# Patient Record
Sex: Female | Born: 1955
Health system: Southern US, Community
[De-identification: ages and names within clinical notes are randomized; demographics above are authoritative.]

## PROBLEM LIST (undated history)

## (undated) DIAGNOSIS — H3552 Pigmentary retinal dystrophy: Secondary | ICD-10-CM

## (undated) DIAGNOSIS — F419 Anxiety disorder, unspecified: Secondary | ICD-10-CM

## (undated) DIAGNOSIS — R5383 Other fatigue: Secondary | ICD-10-CM

## (undated) DIAGNOSIS — I872 Venous insufficiency (chronic) (peripheral): Secondary | ICD-10-CM

## (undated) DIAGNOSIS — R002 Palpitations: Secondary | ICD-10-CM

## (undated) DIAGNOSIS — R519 Headache, unspecified: Secondary | ICD-10-CM

## (undated) DIAGNOSIS — R011 Cardiac murmur, unspecified: Secondary | ICD-10-CM

## (undated) DIAGNOSIS — J309 Allergic rhinitis, unspecified: Secondary | ICD-10-CM

## (undated) DIAGNOSIS — H9319 Tinnitus, unspecified ear: Secondary | ICD-10-CM

## (undated) DIAGNOSIS — R001 Bradycardia, unspecified: Secondary | ICD-10-CM

## (undated) DIAGNOSIS — L409 Psoriasis, unspecified: Secondary | ICD-10-CM

## (undated) DIAGNOSIS — C801 Malignant (primary) neoplasm, unspecified: Secondary | ICD-10-CM

## (undated) DIAGNOSIS — R600 Localized edema: Secondary | ICD-10-CM

## (undated) DIAGNOSIS — M7500 Adhesive capsulitis of unspecified shoulder: Secondary | ICD-10-CM

## (undated) DIAGNOSIS — I471 Supraventricular tachycardia: Principal | ICD-10-CM

## (undated) DIAGNOSIS — E785 Hyperlipidemia, unspecified: Secondary | ICD-10-CM

## (undated) DIAGNOSIS — R42 Dizziness and giddiness: Secondary | ICD-10-CM

## (undated) DIAGNOSIS — R0602 Shortness of breath: Secondary | ICD-10-CM

## (undated) DIAGNOSIS — I10 Essential (primary) hypertension: Secondary | ICD-10-CM

## (undated) DIAGNOSIS — D229 Melanocytic nevi, unspecified: Secondary | ICD-10-CM

## (undated) DIAGNOSIS — E079 Disorder of thyroid, unspecified: Secondary | ICD-10-CM

## (undated) HISTORY — DX: Other fatigue: R53.83

## (undated) HISTORY — DX: Palpitations: R00.2

## (undated) HISTORY — DX: Essential (primary) hypertension: I10

## (undated) HISTORY — DX: Dizziness and giddiness: R42

## (undated) HISTORY — PX: OTHER SURGICAL HISTORY: SHX169

## (undated) HISTORY — PX: ABLATION ON ENDOMETRIOSIS: SHX5787

## (undated) HISTORY — DX: Disorder of thyroid, unspecified: E07.9

## (undated) HISTORY — DX: Headache, unspecified: R51.9

## (undated) HISTORY — DX: Supraventricular tachycardia: I47.1

## (undated) HISTORY — DX: Hyperlipidemia, unspecified: E78.5

## (undated) HISTORY — DX: Venous insufficiency (chronic) (peripheral): I87.2

## (undated) HISTORY — DX: Cardiac murmur, unspecified: R01.1

## (undated) HISTORY — DX: Bradycardia, unspecified: R00.1

## (undated) HISTORY — DX: Anxiety disorder, unspecified: F41.9

## (undated) HISTORY — PX: NASAL SINUS SURGERY: SHX719

## (undated) HISTORY — DX: Psoriasis, unspecified: L40.9

## (undated) HISTORY — DX: Malignant (primary) neoplasm, unspecified: C80.1

## (undated) HISTORY — DX: Pigmentary retinal dystrophy: H35.52

## (undated) HISTORY — PX: BREAST EXCISIONAL BIOPSY: SUR124

## (undated) HISTORY — DX: Shortness of breath: R06.02

## (undated) HISTORY — DX: Allergic rhinitis, unspecified: J30.9

## (undated) HISTORY — DX: Adhesive capsulitis of unspecified shoulder: M75.00

## (undated) HISTORY — DX: Localized edema: R60.0

## (undated) HISTORY — DX: Tinnitus, unspecified ear: H93.19

---
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
## 2011-06-02 HISTORY — PX: BREAST EXCISIONAL BIOPSY: SUR124

## 2016-03-27 DIAGNOSIS — D229 Melanocytic nevi, unspecified: Secondary | ICD-10-CM

## 2016-03-27 HISTORY — DX: Melanocytic nevi, unspecified: D22.9

## 2016-04-30 DIAGNOSIS — J019 Acute sinusitis, unspecified: Secondary | ICD-10-CM | POA: Diagnosis not present

## 2016-04-30 DIAGNOSIS — D485 Neoplasm of uncertain behavior of skin: Secondary | ICD-10-CM | POA: Diagnosis not present

## 2016-04-30 DIAGNOSIS — L988 Other specified disorders of the skin and subcutaneous tissue: Secondary | ICD-10-CM | POA: Diagnosis not present

## 2016-04-30 DIAGNOSIS — B9689 Other specified bacterial agents as the cause of diseases classified elsewhere: Secondary | ICD-10-CM | POA: Diagnosis not present

## 2016-06-08 DIAGNOSIS — H3552 Pigmentary retinal dystrophy: Secondary | ICD-10-CM | POA: Diagnosis not present

## 2016-06-16 DIAGNOSIS — J3089 Other allergic rhinitis: Secondary | ICD-10-CM | POA: Diagnosis not present

## 2016-06-19 DIAGNOSIS — H3552 Pigmentary retinal dystrophy: Secondary | ICD-10-CM | POA: Diagnosis not present

## 2016-06-30 DIAGNOSIS — H3552 Pigmentary retinal dystrophy: Secondary | ICD-10-CM | POA: Diagnosis not present

## 2016-07-07 DIAGNOSIS — H3552 Pigmentary retinal dystrophy: Secondary | ICD-10-CM | POA: Diagnosis not present

## 2016-07-12 DIAGNOSIS — J019 Acute sinusitis, unspecified: Secondary | ICD-10-CM | POA: Diagnosis not present

## 2016-07-21 DIAGNOSIS — H3552 Pigmentary retinal dystrophy: Secondary | ICD-10-CM | POA: Diagnosis not present

## 2016-07-21 DIAGNOSIS — H53483 Generalized contraction of visual field, bilateral: Secondary | ICD-10-CM | POA: Diagnosis not present

## 2016-07-27 DIAGNOSIS — H53483 Generalized contraction of visual field, bilateral: Secondary | ICD-10-CM | POA: Diagnosis not present

## 2016-07-27 DIAGNOSIS — H3552 Pigmentary retinal dystrophy: Secondary | ICD-10-CM | POA: Diagnosis not present

## 2016-08-03 DIAGNOSIS — H3552 Pigmentary retinal dystrophy: Secondary | ICD-10-CM | POA: Diagnosis not present

## 2016-08-03 DIAGNOSIS — H53483 Generalized contraction of visual field, bilateral: Secondary | ICD-10-CM | POA: Diagnosis not present

## 2016-08-04 DIAGNOSIS — R0981 Nasal congestion: Secondary | ICD-10-CM | POA: Diagnosis not present

## 2016-08-24 DIAGNOSIS — E039 Hypothyroidism, unspecified: Secondary | ICD-10-CM | POA: Diagnosis not present

## 2016-08-24 DIAGNOSIS — N3941 Urge incontinence: Secondary | ICD-10-CM | POA: Diagnosis not present

## 2016-08-24 DIAGNOSIS — F411 Generalized anxiety disorder: Secondary | ICD-10-CM | POA: Diagnosis not present

## 2016-08-24 DIAGNOSIS — I10 Essential (primary) hypertension: Secondary | ICD-10-CM | POA: Diagnosis not present

## 2016-11-10 ENCOUNTER — Other Ambulatory Visit: Payer: Self-pay | Admitting: Internal Medicine

## 2016-11-10 DIAGNOSIS — C4352 Malignant melanoma of skin of breast: Secondary | ICD-10-CM | POA: Diagnosis not present

## 2016-11-10 DIAGNOSIS — Z0000 Encounter for general adult medical examination without abnormal findings: Secondary | ICD-10-CM | POA: Diagnosis not present

## 2016-11-10 DIAGNOSIS — R001 Bradycardia, unspecified: Secondary | ICD-10-CM | POA: Diagnosis not present

## 2016-11-10 DIAGNOSIS — I10 Essential (primary) hypertension: Secondary | ICD-10-CM | POA: Diagnosis not present

## 2016-11-10 DIAGNOSIS — Z1231 Encounter for screening mammogram for malignant neoplasm of breast: Secondary | ICD-10-CM

## 2016-11-20 ENCOUNTER — Ambulatory Visit
Admission: RE | Admit: 2016-11-20 | Discharge: 2016-11-20 | Disposition: A | Discharge status: Home / Self Care | Payer: Medicare Other | Source: Ambulatory Visit | Attending: Internal Medicine | Admitting: Internal Medicine

## 2016-11-20 DIAGNOSIS — Z1231 Encounter for screening mammogram for malignant neoplasm of breast: Secondary | ICD-10-CM

## 2016-11-22 DIAGNOSIS — Z1212 Encounter for screening for malignant neoplasm of rectum: Secondary | ICD-10-CM | POA: Diagnosis not present

## 2016-11-22 DIAGNOSIS — Z1211 Encounter for screening for malignant neoplasm of colon: Secondary | ICD-10-CM | POA: Diagnosis not present

## 2016-12-08 ENCOUNTER — Ambulatory Visit (INDEPENDENT_AMBULATORY_CARE_PROVIDER_SITE_OTHER): Payer: Medicare Other | Admitting: Cardiovascular Disease

## 2016-12-08 ENCOUNTER — Encounter: Payer: Self-pay | Admitting: Cardiovascular Disease

## 2016-12-08 ENCOUNTER — Telehealth (HOSPITAL_COMMUNITY): Payer: Self-pay

## 2016-12-08 VITALS — BP 142/80 | HR 57 | Ht 67.0 in | Wt 190.0 lb

## 2016-12-08 DIAGNOSIS — R0609 Other forms of dyspnea: Secondary | ICD-10-CM

## 2016-12-08 DIAGNOSIS — E7800 Pure hypercholesterolemia, unspecified: Secondary | ICD-10-CM

## 2016-12-08 DIAGNOSIS — R5383 Other fatigue: Secondary | ICD-10-CM | POA: Insufficient documentation

## 2016-12-08 DIAGNOSIS — R600 Localized edema: Secondary | ICD-10-CM | POA: Diagnosis not present

## 2016-12-08 DIAGNOSIS — R001 Bradycardia, unspecified: Secondary | ICD-10-CM

## 2016-12-08 DIAGNOSIS — R011 Cardiac murmur, unspecified: Secondary | ICD-10-CM | POA: Diagnosis not present

## 2016-12-08 DIAGNOSIS — R0600 Dyspnea, unspecified: Secondary | ICD-10-CM

## 2016-12-08 DIAGNOSIS — E785 Hyperlipidemia, unspecified: Secondary | ICD-10-CM

## 2016-12-08 DIAGNOSIS — R002 Palpitations: Secondary | ICD-10-CM | POA: Diagnosis not present

## 2016-12-08 DIAGNOSIS — I10 Essential (primary) hypertension: Secondary | ICD-10-CM | POA: Diagnosis not present

## 2016-12-08 DIAGNOSIS — R0602 Shortness of breath: Secondary | ICD-10-CM

## 2016-12-08 HISTORY — DX: Hyperlipidemia, unspecified: E78.5

## 2016-12-08 HISTORY — DX: Palpitations: R00.2

## 2016-12-08 HISTORY — DX: Localized edema: R60.0

## 2016-12-08 HISTORY — DX: Shortness of breath: R06.02

## 2016-12-08 HISTORY — DX: Other fatigue: R53.83

## 2016-12-08 HISTORY — DX: Essential (primary) hypertension: I10

## 2016-12-08 NOTE — Telephone Encounter (Signed)
Encounter complete. 

## 2016-12-08 NOTE — Patient Instructions (Signed)
Medication Instructions:  START TAKING THE HYDROCHLOROTHIAZIDE 12.5 MG DAILY IN ADDITION TO YOUR OTHER MEDICATIONS  Labwork: NONE  Testing/Procedures: Your physician has requested that you have a lexiscan myoview. For further information please visit [email or web address]. Please follow instruction sheet, as given.  Your physician has requested that you have an echocardiogram. Echocardiography is a painless test that uses sound waves to create images of your heart. It provides your doctor with information about the size and shape of your heart and how well your heart's chambers and valves are working. This procedure takes approximately one hour. There are no restrictions for this procedure. [place] [address]  Your physician has recommended that you wear an event monitor. Event monitors are medical devices that record the heart's electrical activity. Doctors most often [place] these monitors to diagnose arrhythmias. Arrhythmias are problems with the speed or rhythm of the heartbeat. The monitor is a small, portable device. You can wear one while you do your normal daily activities. This is usually used to diagnose what is causing palpitations/syncope (passing out). [place] [address]   Follow-Up: Your physician recommends that you schedule a follow-up appointment in: 1 MONTH OV  Any Other Special Instructions Will Be Listed Below (If Applicable). MONITOR YOUR BLOOD PRESSURE AND BRING READINGS TO YOUR FOLLOW UP APPOINTMENT     If you need a refill on your cardiac medications before your next appointment, please call your pharmacy.

## 2016-12-08 NOTE — Progress Notes (Signed)
Cardiology Office Note   Date:  12/08/2016   ID:  Sheri Lopez, DOB 03-24-1956, MRN [id number]  PCP:  [name], MD  Cardiologist:   Skeet Latch, MD   Chief Complaint  Patient presents with  . New Patient (Initial Visit)  . Edema    left leg and ankles.     History of Present Illness: Sheri Lopez is a 61 y.o. female with hypertension, bradycardia, legal blindness, and breast cancer who is being seen today for the evaluation of murmur at the request of Schoenhoff, Altamese Cabal, *.  At her appointment with Dr. Coralyn Mark, Sheri Lopez was noted to be bradycardic and had a murmur on exam. Her heart rate has been as low as 44 bpm on EKG. She was referred to cardiology for further evaluation.  Sheri Lopez reports that she was first told she had a murmur approximately 30 years ago while pregnant. Incidentally has been intermittently noted exams. She recalls having an echocardiogram and a stress test at that time. She also had what sounds like a heart catheterization. Reportedly the results were all unremarkable and she has not followed up with a cardiologist since then. Lately she has been feeling fairly well but notes that she gets tired when walking. She also has episodes where she is exhausted and has to take a nap in the early afternoon. She denies chest pain or pressure, nausea, or diaphoresis. She does note episodes of palpitations That lasts for approximately 1 minute. It typically occurs at rest and feels that her heart is pounding. Sometimes he gets better with laying down and relaxing. The episodes occur sporadically approximately once or twice every 2 weeks.  Sheri Lopez endorses lower extremity edema that doesn't always improve with elevation.  It is been chronically worse in the left leg than the right. She denies orthopnea or PND. She reports that her father had a low heart rate and had bypass surgery in his 57s.She does not drink much caffeine or use over-the-counter  cold/cough medications regularly. In the past she liked exercise by walking but has been unable to do so lately due to the hot weather and her fatigue.  Past Medical History:  Diagnosis Date  . Allergic rhinitis   . Anxiety   . Bradycardia   . Cancer [facility])    breast  . Essential hypertension 12/08/2016  . Fatigue 12/08/2016  . Frozen shoulder   . Heart murmur   . Hyperlipidemia 12/08/2016  . Hypertension   . Lower extremity edema 12/08/2016  . Palpitations 12/08/2016  . Psoriasis   . Retinitis pigmentosa   . Shortness of breath 12/08/2016  . Thyroid disease   . Venous insufficiency     Past Surgical History:  Procedure Laterality Date  . BREAST EXCISIONAL BIOPSY Right   . BREAST EXCISIONAL BIOPSY Left 2013     Current Outpatient Prescriptions  Medication Sig Dispense Refill  . Azelastine-Fluticasone 137-50 MCG/ACT SUSP Place 1 puff into the nose 2 (two) times daily.    . cetirizine (ZYRTEC) 10 MG tablet Take 10 mg by mouth daily.    . citalopram (CELEXA) 20 MG tablet Take 20 mg by mouth daily.    . clobetasol cream (TEMOVATE) 5.40 % Apply 1 application topically 2 (two) times daily.    . hydrochlorothiazide (MICROZIDE) 12.5 MG capsule Take 12.5 mg by mouth daily.    Marland Kitchen levothyroxine (SYNTHROID, LEVOTHROID) 75 MCG tablet Take 75 mcg by mouth daily before breakfast.    . losartan-hydrochlorothiazide (HYZAAR)  100-12.5 MG tablet Take 1 tablet by mouth daily.    . NON FORMULARY Take by mouth daily. Plexus supplement     No current facility-administered medications for this visit.     Allergies:   Keflex [cephalexin]    Social History:  The patient  reports that she has never smoked. She has never used smokeless tobacco.   Family History:  [name] history includes Arrhythmia in her mother; Breast cancer in her maternal aunt; CAD in her father; Cancer in her father and mother; Heart attack in her maternal grandfather and paternal grandfather; Heart failure in her  paternal grandmother; Hyperlipidemia in her mother; Hypertension in her brother, mother, and sister.    ROS:  Please see the history of present illness.   Otherwise, review of systems are positive for none.   All other systems are reviewed and negative.    PHYSICAL EXAM: VS:  BP (!) 142/80   Pulse (!) 57   Ht 5\' 7"  (1.702 m)   Wt 86.2 kg (190 lb)   BMI 29.76 kg/m  , BMI Body mass index is 29.76 kg/m. GENERAL:  Well appearing.  No acute distress HEENT:  Pupils equal round and reactive, fundi not visualized, oral mucosa unremarkable NECK:  No jugular venous distention, waveform within normal limits, carotid upstroke brisk and symmetric, no bruits, no thyromegaly LYMPHATICS:  No cervical adenopathy LUNGS:  Clear to auscultation bilaterally HEART:  RRR.  PMI not displaced or sustained,S1 and S2 within normal limits, no S3, no S4, no clicks, no rubs, no murmurs ABD:  Flat, positive bowel sounds normal in frequency in pitch, no bruits, no rebound, no guarding, no midline pulsatile mass, no hepatomegaly, no splenomegaly EXT:  2 plus pulses throughout, no edema, no cyanosis no clubbing SKIN:  No rashes no nodules NEURO:  Cranial nerves II through XII grossly intact, motor grossly intact throughout PSYCH:  Cognitively intact, oriented to person place and time  EKG:  EKG is ordered today. The ekg ordered today demonstrates Sinus bradycardia. Rate 57 bpm. PVCs. Low-voltage 07/18/11: 11/10/16: Sinus bradycardia. Rate 48 bpm.  Recent Labs: No results found for requested labs within last 8760 hours.   08/24/16: Sodium 141, potassium 4.1, BUN 17, creatinine 0.94 AST 16, ALT 15 TSH 2.54 Total cholesterol 229, triglycerides 139, HDL 79, LDL 123   Lipid Panel No results found for: CHOL, TRIG, HDL, CHOLHDL, VLDL, LDLCALC, LDLDIRECT    Wt Readings from Last 3 Encounters:  12/08/16 86.2 kg (190 lb)      ASSESSMENT AND PLAN:  # Fatigue: # Shortness of breath: Symptoms could be consistent  with ischemia. She does have a family history of premature CAD. We will get a Lexiscan Myoview to assess. She is legally blind and is unable to walk on the treadmill.  # Hypertension: Blood pressure is elevated here and has been somewhat high at home. This may be contributing to her exertional dyspnea. We will increase hydrochlorothiazide to 25 mg daily. She will keep a log of her blood pressures at home.  # Palpitations:  # Bradycardia: 14 day event monitor.  # Hyperlipidemia: ASCVD 10 year risk is 6.0%. No aspirin or statin at this time. Work on diet and exercise.  # Murmur:  # Lower extremity edema: Sheri Lopez was noted to have a murmur on exam with her PCP.  This is been noted intermittently for several years. She also has lower extremity edema on exam. Although I did not hear a murmur on exam  today, we will get an echocardiogram to assess.    Current medicines are reviewed at length with the patient today.  The patient does not have concerns regarding medicines.  The following changes have been made:  Increase HCTZ to 25mg  daily  Labs/ tests ordered today include:   Orders Placed This Encounter  Procedures  . Myocardial Perfusion Imaging  . Cardiac event monitor  . EKG 12-Lead  . ECHOCARDIOGRAM COMPLETE     Disposition:   FU with Maiyah Goyne C. [name], MD, [facility] in month.    This note was written with the assistance of speech recognition software.  Please excuse any transcriptional errors.  Signed, Ludivina Guymon C. [name], MD, [facility]  12/08/2016 1:28 PM    [place]

## 2016-12-09 ENCOUNTER — Ambulatory Visit (HOSPITAL_COMMUNITY)
Admission: RE | Admit: 2016-12-09 | Discharge: 2016-12-09 | Disposition: A | Discharge status: Home / Self Care | Payer: Medicare Other | Source: Ambulatory Visit | Attending: Cardiology | Admitting: Cardiology

## 2016-12-09 DIAGNOSIS — I10 Essential (primary) hypertension: Secondary | ICD-10-CM | POA: Diagnosis not present

## 2016-12-09 DIAGNOSIS — R0609 Other forms of dyspnea: Secondary | ICD-10-CM | POA: Diagnosis not present

## 2016-12-09 DIAGNOSIS — Z8249 Family history of ischemic heart disease and other diseases of the circulatory system: Secondary | ICD-10-CM | POA: Diagnosis not present

## 2016-12-09 DIAGNOSIS — R002 Palpitations: Secondary | ICD-10-CM

## 2016-12-09 DIAGNOSIS — R5383 Other fatigue: Secondary | ICD-10-CM | POA: Diagnosis not present

## 2016-12-09 DIAGNOSIS — R0600 Dyspnea, unspecified: Secondary | ICD-10-CM

## 2016-12-09 MED ORDER — REGADENOSON 0.4 MG/5ML IV SOLN
0.4000 mg | Freq: Once | INTRAVENOUS | Status: AC
  Administered 2016-12-09: 0.4 mg via INTRAVENOUS

## 2016-12-09 MED ORDER — TECHNETIUM TC 99M TETROFOSMIN IV KIT
32.1000 | PACK | Freq: Once | INTRAVENOUS | Status: AC | PRN
  Administered 2016-12-09: 32.1 via INTRAVENOUS
  Filled 2016-12-09: qty 33

## 2016-12-09 MED ORDER — TECHNETIUM TC 99M TETROFOSMIN IV KIT
10.1000 | PACK | Freq: Once | INTRAVENOUS | Status: AC | PRN
  Administered 2016-12-09: 10.1 via INTRAVENOUS
  Filled 2016-12-09: qty 11

## 2016-12-10 LAB — MYOCARDIAL PERFUSION IMAGING
LV dias vol: 118 mL (ref 46–106)
LV sys vol: 49 mL
Peak HR: 71 {beats}/min
Rest HR: 49 {beats}/min
SDS: 2
SRS: 0
SSS: 2
TID: 1.13

## 2016-12-21 ENCOUNTER — Other Ambulatory Visit: Payer: Self-pay

## 2016-12-21 ENCOUNTER — Ambulatory Visit (HOSPITAL_COMMUNITY): Payer: Medicare Other | Attending: Cardiovascular Disease

## 2016-12-21 DIAGNOSIS — R011 Cardiac murmur, unspecified: Secondary | ICD-10-CM | POA: Diagnosis not present

## 2016-12-21 DIAGNOSIS — R0600 Dyspnea, unspecified: Secondary | ICD-10-CM

## 2016-12-21 DIAGNOSIS — R0609 Other forms of dyspnea: Secondary | ICD-10-CM

## 2016-12-21 DIAGNOSIS — R002 Palpitations: Secondary | ICD-10-CM

## 2016-12-21 LAB — ECHOCARDIOGRAM COMPLETE
E decel time: 275 msec
E/e' ratio: 9.12
FS: 31 % (ref 28–44)
IVS/LV PW RATIO, ED: 1.19
LA ID, A-P, ES: 39 mm
LA diam end sys: 39 mm
LA diam index: 1.97 cm/m2
LA vol A4C: 48.4 ml
LA vol index: 27.2 mL/m2
LA vol: 53.9 mL
LV E/e' medial: 9.12
LV E/e'average: 9.12
LV PW d: 8.4 mm — AB (ref 0.6–1.1)
LV e' LATERAL: 9.14 cm/s
LVOT SV: 76 mL
LVOT VTI: 24.1 cm
LVOT area: 3.14 cm2
LVOT diameter: 20 mm
LVOT peak grad rest: 5 mmHg
LVOT peak vel: 112 cm/s
Lateral S' vel: 12.8 cm/s
MV Dec: 275
MV Peak grad: 3 mmHg
MV pk A vel: 64 m/s
MV pk E vel: 83.4 m/s
Reg peak vel: 201 cm/s
TAPSE: 23.4 mm
TDI e' lateral: 9.14
TDI e' medial: 8.59
TR max vel: 201 cm/s

## 2017-01-04 ENCOUNTER — Ambulatory Visit (INDEPENDENT_AMBULATORY_CARE_PROVIDER_SITE_OTHER): Payer: Medicare Other

## 2017-01-04 DIAGNOSIS — R002 Palpitations: Secondary | ICD-10-CM | POA: Diagnosis not present

## 2017-01-04 DIAGNOSIS — R0600 Dyspnea, unspecified: Secondary | ICD-10-CM

## 2017-01-04 DIAGNOSIS — R0609 Other forms of dyspnea: Secondary | ICD-10-CM | POA: Diagnosis not present

## 2017-01-27 ENCOUNTER — Ambulatory Visit (INDEPENDENT_AMBULATORY_CARE_PROVIDER_SITE_OTHER): Payer: Medicare Other | Admitting: Cardiovascular Disease

## 2017-01-27 ENCOUNTER — Encounter: Payer: Self-pay | Admitting: Cardiovascular Disease

## 2017-01-27 VITALS — BP 122/82 | HR 60 | Ht 67.75 in | Wt 191.6 lb

## 2017-01-27 DIAGNOSIS — R0602 Shortness of breath: Secondary | ICD-10-CM | POA: Diagnosis not present

## 2017-01-27 DIAGNOSIS — I471 Supraventricular tachycardia: Secondary | ICD-10-CM | POA: Diagnosis not present

## 2017-01-27 DIAGNOSIS — I10 Essential (primary) hypertension: Secondary | ICD-10-CM | POA: Diagnosis not present

## 2017-01-27 DIAGNOSIS — I4719 Other supraventricular tachycardia: Secondary | ICD-10-CM

## 2017-01-27 DIAGNOSIS — I491 Atrial premature depolarization: Secondary | ICD-10-CM | POA: Diagnosis not present

## 2017-01-27 DIAGNOSIS — R600 Localized edema: Secondary | ICD-10-CM | POA: Diagnosis not present

## 2017-01-27 DIAGNOSIS — I493 Ventricular premature depolarization: Secondary | ICD-10-CM | POA: Diagnosis not present

## 2017-01-27 HISTORY — DX: Other supraventricular tachycardia: I47.19

## 2017-01-27 HISTORY — DX: Supraventricular tachycardia: I47.1

## 2017-01-27 MED ORDER — HYDROCHLOROTHIAZIDE 12.5 MG PO TABS: ORAL_TABLET | ORAL | 1 refills | Status: DC

## 2017-01-27 NOTE — Progress Notes (Signed)
Cardiology Office Note   Date:  01/27/2017   ID:  Sheri Lopez, DOB [birth year], MRN [id number]  PCP:  [name], MD  Cardiologist:   Skeet Latch, MD   Chief Complaint  Patient presents with  . Follow-up    History of Present Illness: Sheri Lopez is a 61 y.o. female with hypertension, bradycardia, legal blindness, and breast cancer here for follow-up. She was initially seen 11/2016 for the evaluation of murmur.  She had previously seen her PCP and was noted to be bradycardic. Her heart rate was 44 on the ECG. She was also noted to have systolic murmur. Upon her clinical appointment with me no murmur was appreciated.  She was referred for an echo that revealed LVEF 55-60% and no significant valvular abnormalities. She also reported atypical chest pain and was referred for [facility] 12/09/16 where she was found to have normal systolic function and no ischemia.  She reported palpitations and had a 14 day event monitor that showed PACs, PVCs, and atrial tachycardia. She was noted to have both sinus rhythm and sinus bradycardia with an average heart rate of 62 bpm. There were no significant pauses.  At her last appointment [name] reported lower extremity edema and her blood pressure was poorly-controlled. Her thiazide was increased to 25 mg daily.  She brings a log of her blood pressures today that showed it has ranged from 90s to low 140s of her blood pressures have been the 100s to 110s. She continues to report shortness of breath with exertion. She particularly notes that when walking up the steps. At these times she feels that her heart is pounding. She also notes heart racing at rest that is very bothersome to her.  She also reports persistent lower extremity edema that has been worse this summer than in the past. She has a history of venous insufficiency and his previously undergone stripping of her lower extremity veins twice. She notes that she has not been wearing  compression socks lately and that she has been lax on limiting salt intake.  She denies any orthopnea or PND.   Past Medical History:  Diagnosis Date  . Allergic rhinitis   . Anxiety   . Atrial tachycardia ([place]) 01/27/2017  . Bradycardia   . Cancer [facility])    breast  . Essential hypertension 12/08/2016  . Fatigue 12/08/2016  . Frozen shoulder   . Heart murmur   . Hyperlipidemia 12/08/2016  . Hypertension   . Lower extremity edema 12/08/2016  . Palpitations 12/08/2016  . Psoriasis   . Retinitis pigmentosa   . Shortness of breath 12/08/2016  . Thyroid disease   . Venous insufficiency     Past Surgical History:  Procedure Laterality Date  . BREAST EXCISIONAL BIOPSY Right   . BREAST EXCISIONAL BIOPSY Left 2013     Current Outpatient Prescriptions  Medication Sig Dispense Refill  . Azelastine-Fluticasone 137-50 MCG/ACT SUSP Place 1 puff into the nose 2 (two) times daily.    . cetirizine (ZYRTEC) 10 MG tablet Take 10 mg by mouth daily.    . citalopram (CELEXA) 20 MG tablet Take 20 mg by mouth daily.    . clobetasol cream (TEMOVATE) 0.17 % Apply 1 application topically 2 (two) times daily. AS NEEDED    . levothyroxine (SYNTHROID, LEVOTHROID) 75 MCG tablet Take 75 mcg by mouth daily before breakfast.    . losartan-hydrochlorothiazide (HYZAAR) 100-12.5 MG tablet Take 1 tablet by mouth daily.    . NON FORMULARY  Take by mouth daily. Plexus supplement    . hydrochlorothiazide (HYDRODIURIL) 12.5 MG tablet 1/2 tablet by mouth daily 45 tablet 1   No current facility-administered medications for this visit.     Allergies:   Keflex [cephalexin]    Social History:  The patient  reports that she has never smoked. She has never used smokeless tobacco.   Family History:  [name] history includes Arrhythmia in her mother; Breast cancer in her maternal aunt; CAD in her father; Cancer in her father and mother; Heart attack in her maternal grandfather and paternal grandfather; Heart  failure in her paternal grandmother; Hyperlipidemia in her mother; Hypertension in her brother, mother, and sister.    ROS:  Please see the history of present illness.   Otherwise, review of systems are positive for none.   All other systems are reviewed and negative.    PHYSICAL EXAM: VS:  BP 122/82   Pulse 60   Ht 5' 7.75" (1.721 m)   Wt 86.9 kg (191 lb 9.6 oz)   BMI 29.35 kg/m  , BMI Body mass index is 29.35 kg/m. GENERAL:  Well appearing.  No acute distress HEENT:  Pupils equal round and reactive, fundi not visualized, oral mucosa unremarkable NECK:  No jugular venous distention, waveform within normal limits, carotid upstroke brisk and symmetric, no bruits, no thyromegaly LYMPHATICS:  No cervical adenopathy LUNGS:  Clear to auscultation bilaterally HEART:  RRR.  PMI not displaced or sustained,S1 and S2 within normal limits, no S3, no S4, no clicks, no rubs, no murmurs ABD:  Flat, positive bowel sounds normal in frequency in pitch, no bruits, no rebound, no guarding, no midline pulsatile mass, no hepatomegaly, no splenomegaly EXT:  2 plus pulses throughout, no edema, no cyanosis no clubbing SKIN:  No rashes no nodules NEURO:  Cranial nerves II through XII grossly intact, motor grossly intact throughout PSYCH:  Cognitively intact, oriented to person place and time  EKG:  EKG is ordered today. The ekg ordered today demonstrates Sinus bradycardia. Rate 57 bpm. PVCs. Low-voltage 07/18/11: 11/10/16: Sinus bradycardia. Rate 48 bpm.  Lexiscan Myoview 12/10/16::  Normal perfusion No ischemia  Nuclear stress EF: 59%.  Low risk study   Echo 12/21/16:  Study Conclusions  - Left ventricle: The cavity size was normal. Systolic function was   normal. The estimated ejection fraction was in the range of 55%   to 60%. Wall motion was normal; there were no regional wall   motion abnormalities. Left ventricular diastolic function   parameters were normal.  14 Day Event Monitor  01/04/17:  Quality: Fair.  Baseline artifact. Predominant rhythm: sinus rhythm, sinus bradycardia Average heart rate: 62 bpm  Sinus rhythm and sinus bradycardia  PACs, PVCs Atrial tachycardia  Recent Labs: No results found for requested labs within last 8760 hours.   08/24/16: Sodium 141, potassium 4.1, BUN 17, creatinine 0.94 AST 16, ALT 15 TSH 2.54 Total cholesterol 229, triglycerides 139, HDL 79, LDL 123   Lipid Panel No results found for: CHOL, TRIG, HDL, CHOLHDL, VLDL, LDLCALC, LDLDIRECT    Wt Readings from Last 3 Encounters:  01/27/17 86.9 kg (191 lb 9.6 oz)  12/09/16 86.2 kg (190 lb)  12/08/16 86.2 kg (190 lb)      ASSESSMENT AND PLAN:  PFTs EP for atrial tach HCTZ 6.25   # Fatigue: # Shortness of breath: Ms. Frary continues to have symptoms.  Her stress test and echo have been unremarkable.  We will get PFTs.    #  Hypertension: BP well-controlled and has been low at times.  Reduce HCTZ to 6.25 mg and continue the combination HCTZ/losartan at 12.5/100mg .    # Atrial tachycardia: # PACs/PVCs: She continues to have palpitations both at rest and with exertion.  No nodal agents given bradycardia in the 50s at baseline.  We will refer her to EP to see if she may be a candidate for ablation vs. antiarrhythmias for the atrial tachycardia.   # Hyperlipidemia: ASCVD 10 year risk is 6.0%. No aspirin or statin at this time. Work on diet and exercise.  # Murmur:  # Lower extremity edema: No significant valvular disease.  IVC was not dilated and she has no evidence of heart failure.  I suspect that her symtpoms are 2/2 venous insufficiency.  Recommended salt restriction, elevation and compression stockings. HCTZ as above.    Current medicines are reviewed at length with the patient today.  The patient does not have concerns regarding medicines.  The following changes have been made:  Decrease HCTZ  Labs/ tests ordered today include:   Orders Placed This  Encounter  Procedures  . Ambulatory referral to Cardiac Electrophysiology  . Pulmonary function test     Disposition:   FU with Chonda Baney C. [name], MD, [facility] in 2 months.    This note was written with the assistance of speech recognition software.  Please excuse any transcriptional errors.  Signed, Vaun Hyndman C. [name], MD, [facility]  01/27/2017 1:17 PM    [place]

## 2017-01-27 NOTE — Patient Instructions (Signed)
Medication Instructions:  DECREASE YOUR HYDROCHLOROTHIAZIDE TO 12.5 MG 1/2 TABLET DAILY   Labwork: NONE  Testing/Procedures: Your physician has recommended that you have a pulmonary function test. Pulmonary Function Tests are a group of tests that measure how well air moves in and out of your lungs.  Follow-Up: Your physician recommends that you schedule a follow-up appointment in: 2 Harvey have been referred to ELECTROPHYSIOLOGIST AT [place]  If you need a refill on your cardiac medications before your next appointment, please call your pharmacy.

## 2017-02-02 ENCOUNTER — Ambulatory Visit (HOSPITAL_COMMUNITY)
Admission: RE | Admit: 2017-02-02 | Discharge: 2017-02-02 | Disposition: A | Discharge status: Home / Self Care | Payer: Medicare Other | Source: Ambulatory Visit | Attending: Cardiovascular Disease | Admitting: Cardiovascular Disease

## 2017-02-02 DIAGNOSIS — R0602 Shortness of breath: Secondary | ICD-10-CM

## 2017-02-02 LAB — PULMONARY FUNCTION TEST
DL/VA % pred: 85 %
DL/VA: 4.51 ml/min/mmHg/L
DLCO unc % pred: 79 %
DLCO unc: 23.63 ml/min/mmHg
FEF 25-75 Post: 3.43 L/sec
FEF 25-75 Pre: 2.36 L/sec
FEF2575-%Change-Post: 45 %
FEF2575-%Pred-Post: 137 %
FEF2575-%Pred-Pre: 94 %
FEV1-%Change-Post: 10 %
FEV1-%Pred-Post: 101 %
FEV1-%Pred-Pre: 91 %
FEV1-Post: 2.93 L
FEV1-Pre: 2.66 L
FEV1FVC-%Change-Post: 3 %
FEV1FVC-%Pred-Pre: 99 %
FEV6-%Change-Post: 6 %
FEV6-%Pred-Post: 100 %
FEV6-%Pred-Pre: 93 %
FEV6-Post: 3.62 L
FEV6-Pre: 3.39 L
FEV6FVC-%Change-Post: 0 %
FEV6FVC-%Pred-Post: 104 %
FEV6FVC-%Pred-Pre: 103 %
FVC-%Change-Post: 6 %
FVC-%Pred-Post: 96 %
FVC-%Pred-Pre: 90 %
FVC-Post: 3.62 L
FVC-Pre: 3.41 L
Post FEV1/FVC ratio: 81 %
Post FEV6/FVC ratio: 100 %
Pre FEV1/FVC ratio: 78 %
Pre FEV6/FVC Ratio: 99 %
RV % pred: 87 %
RV: 1.95 L
TLC % pred: 101 %
TLC: 5.76 L

## 2017-02-02 MED ORDER — ALBUTEROL SULFATE (2.5 MG/3ML) 0.083% IN NEBU
2.5000 mg | INHALATION_SOLUTION | Freq: Once | RESPIRATORY_TRACT | Status: AC
  Administered 2017-02-02: 2.5 mg via RESPIRATORY_TRACT

## 2017-02-08 ENCOUNTER — Encounter: Payer: Self-pay | Admitting: Cardiology

## 2017-02-08 ENCOUNTER — Ambulatory Visit (INDEPENDENT_AMBULATORY_CARE_PROVIDER_SITE_OTHER): Payer: Medicare Other | Admitting: Cardiology

## 2017-02-08 VITALS — BP 120/84 | HR 54 | Ht 68.0 in | Wt 192.8 lb

## 2017-02-08 DIAGNOSIS — E785 Hyperlipidemia, unspecified: Secondary | ICD-10-CM | POA: Diagnosis not present

## 2017-02-08 DIAGNOSIS — I471 Supraventricular tachycardia: Secondary | ICD-10-CM | POA: Diagnosis not present

## 2017-02-08 MED ORDER — FLECAINIDE ACETATE 100 MG PO TABS: 100.0000 mg | ORAL_TABLET | Freq: Two times a day (BID) | ORAL | 3 refills | Status: DC

## 2017-02-08 MED ORDER — METOPROLOL SUCCINATE ER 25 MG PO TB24: 12.5000 mg | ORAL_TABLET | Freq: Every day | ORAL | 3 refills | Status: DC

## 2017-02-08 NOTE — Patient Instructions (Signed)
Your physician has recommended you make the following change in your medication:  1.) start Toprol XL 12.5 mg (1/2 tablet) daily 2.) start flecainide 100 mg twice daily  Your physician has requested that you have an exercise tolerance test. For further information please visit [email or web address]. Please also follow instruction sheet, as given.--PLEASE SCHEDULE IN ABOUT 7-10 DAYS--PT STARTING FLECAINIDE  Your physician recommends that you schedule a follow-up appointment in: EARLY November WITH DR. Curt Bears.

## 2017-02-08 NOTE — Progress Notes (Signed)
Electrophysiology Office Note   Date:  02/08/2017   ID:  Sheri Lopez, DOB [birth year], MRN [id number]  PCP:  [name], MD  Cardiologist:  [name] Primary Electrophysiologist:  Brigida Scotti [name], MD    Chief Complaint  Patient presents with  . New Patient (Initial Visit)    Atrial Tachycardia/SOB     History of Present Illness: Sheri Lopez is a 61 y.o. female who is being seen today for the evaluation of SVT at the request of Schoenhoff, Altamese Cabal, *. Presenting today for electrophysiology evaluation. She has a history of hypertension, bradycardia, legal blindness, and breast cancer. She was noted. Bradycardic by her PCP with heart rates of 44 on EKG. She has had an echo that showed normal systolic function and a Myoview with normal function and no ischemia. She had palpitations and wore a 14 day monitor that showed PACs, PVCs and atrial tachycardia. Her average heart rate was 62. There were no significant pauses. She has shortness of breath with exertion. She notes that she is having trouble walking upstairs. She feels palpitations at that time.    Today, she denies symptoms of palpitations, chest pain, shortness of breath, orthopnea, PND, lower extremity edema, claudication, dizziness, presyncope, syncope, bleeding, or neurologic sequela. The patient is tolerating medications without difficulties. Her main complaint today is of fatigue. She does get palpitations a few times a week. No exacerbating or alleviating factors. They last for a few seconds up to a few minutes at a time.   Past Medical History:  Diagnosis Date  . Allergic rhinitis   . Anxiety   . Atrial tachycardia ([place]) 01/27/2017  . Bradycardia   . Cancer [facility])    breast  . Essential hypertension 12/08/2016  . Fatigue 12/08/2016  . Frozen shoulder   . Heart murmur   . Hyperlipidemia 12/08/2016  . Hypertension   . Lower extremity edema 12/08/2016  . Palpitations 12/08/2016  . Psoriasis   . Retinitis  pigmentosa   . Shortness of breath 12/08/2016  . Thyroid disease   . Venous insufficiency    Past Surgical History:  Procedure Laterality Date  . BREAST EXCISIONAL BIOPSY Right   . BREAST EXCISIONAL BIOPSY Left 2013     Current Outpatient Prescriptions  Medication Sig Dispense Refill  . Azelastine-Fluticasone 137-50 MCG/ACT SUSP Place 1 puff into the nose 2 (two) times daily.    . cetirizine (ZYRTEC) 10 MG tablet Take 10 mg by mouth daily.    . citalopram (CELEXA) 20 MG tablet Take 20 mg by mouth daily.    . clobetasol cream (TEMOVATE) 9.38 % Apply 1 application topically 2 (two) times daily. AS NEEDED    . hydrochlorothiazide (HYDRODIURIL) 12.5 MG tablet 1/2 tablet by mouth daily 45 tablet 1  . ibuprofen (ADVIL,MOTRIN) 200 MG tablet Take 600 mg by mouth every 6 (six) hours as needed (tooth pain).    Marland Kitchen levothyroxine (SYNTHROID, LEVOTHROID) 75 MCG tablet Take 75 mcg by mouth daily before breakfast.    . losartan-hydrochlorothiazide (HYZAAR) 100-12.5 MG tablet Take 1 tablet by mouth daily.    . NON FORMULARY Take by mouth daily. Plexus supplement    . penicillin v potassium (VEETID) 500 MG tablet Take 500 mg by mouth 4 (four) times daily.     No current facility-administered medications for this visit.     Allergies:   Keflex [cephalexin]   Social History:  The patient  reports that she has never smoked. She has never used smokeless tobacco.  Family History:  [name] history includes Arrhythmia in her mother; Breast cancer in her maternal aunt; CAD in her father; Cancer in her father and mother; Heart attack in her maternal grandfather and paternal grandfather; Heart failure in her paternal grandmother; Hyperlipidemia in her mother; Hypertension in her brother, mother, and sister.    ROS:  Please see the history of present illness.   Otherwise, review of systems is positive for Palpitations, fatigue.   All other systems are reviewed and negative.    PHYSICAL  EXAM: VS:  BP 120/84   Pulse (!) 54   Ht 5\' 8"  (1.727 m)   Wt 192 lb 12.8 oz (87.5 kg)   BMI 29.32 kg/m  , BMI Body mass index is 29.32 kg/m. GEN: Well nourished, well developed, in no acute distress  HEENT: normal  Neck: no JVD, carotid bruits, or masses Cardiac: RRR; no murmurs, rubs, or gallops,no edema  Respiratory:  clear to auscultation bilaterally, normal work of breathing GI: soft, nontender, nondistended, + BS MS: no deformity or atrophy  Skin: warm and dry Neuro:  Strength and sensation are intact Psych: euthymic mood, full affect  EKG:  EKG is not ordered today. Personal review of the ekg ordered 12/08/16 shows sinus rhythm, rate 59, PVC  Recent Labs: No results found for requested labs within last 8760 hours.    Lipid Panel  No results found for: CHOL, TRIG, HDL, CHOLHDL, VLDL, LDLCALC, LDLDIRECT   Wt Readings from Last 3 Encounters:  02/08/17 192 lb 12.8 oz (87.5 kg)  01/27/17 191 lb 9.6 oz (86.9 kg)  12/09/16 190 lb (86.2 kg)      Other studies Reviewed: Additional studies/ records that were reviewed today include: Myoview 12/10/16  Review of the above records today demonstrates:   Normal perfusion No ischemia  Nuclear stress EF: 59%.  Low risk study  TTE 12/21/16 - Left ventricle: The cavity size was normal. Systolic function was   normal. The estimated ejection fraction was in the range of 55%   to 60%. Wall motion was normal; there were no regional wall   motion abnormalities. Left ventricular diastolic function   parameters were normal.  Cardiac monitor 01/26/17 - personally reviewed Predominant rhythm: sinus rhythm, sinus bradycardia Average heart rate: 62 bpm  Sinus rhythm and sinus bradycardia  PACs, PVCs Atrial tachycardia  ASSESSMENT AND PLAN:  1.  Atrial tachycardia: Currently non-AV nodal blocking agents due to resting heart rate in the 50s.She would benefit from antiarrhythmics, but would also benefit likely from ablation. Risks  and benefits of ablation were discussed. Risks include bleeding, tamponade, heart block, and stroke among others. She would like to think about this for a few months. In the interim, we'll start her on 12.5 mg of metoprolol as well as 100 mg of flecainide. She Leane Loring come back in 7-10 days for an exercise treadmill test.  2. Shortness of breath: Stress testing and echo unremarkable. PFTs per primary cardiology.  3. Hyperlipidemia: Currently no aspirin or statin. Diet and exercise.  Current medicines are reviewed at length with the patient today.   The patient does not have concerns regarding her medicines.  The following changes were made today:  Metoprolol, flecainide  Labs/ tests ordered today include:  No orders of the defined types were placed in this encounter.    Disposition:   FU with Dorethia Jeanmarie 2 months  Signed, Obediah Welles [name], MD  02/08/2017 3:40 PM     [facility] [address] [place] [address] [phone] (office) [phone] (fax)

## 2017-02-09 ENCOUNTER — Telehealth: Payer: Self-pay | Admitting: Cardiology

## 2017-02-09 ENCOUNTER — Telehealth: Payer: Self-pay | Admitting: *Deleted

## 2017-02-09 MED ORDER — ALBUTEROL SULFATE HFA 108 (90 BASE) MCG/ACT IN AERS: 2.0000 | INHALATION_SPRAY | RESPIRATORY_TRACT | 2 refills | Status: AC | PRN

## 2017-02-09 NOTE — Telephone Encounter (Signed)
-----   Message from Skeet Latch, MD sent at 02/08/2017  1:29 PM EDT ----- Minimal airway obstruction.  This probably isn't enough to cause her shortness of breath.  Lets try an albuterol inhaler 2 puffs q4h as needed.

## 2017-02-09 NOTE — Telephone Encounter (Signed)
Confirmed with patient instructions to stop Citalopram and start Sertraline 50 mg daily.  Educated that Citalopram and Flecainide cannot be taken together.  Pt informs me that her PCP called and advised of this and made the medication change to safer med to take w/ Flecainide. She thanks me for following up. Will update pt med list to reflect change

## 2017-02-09 NOTE — Telephone Encounter (Signed)
New message   Sheri Lopez called and verbalized that there is a change in medication and she was citalopram 20mg  1x day from her PCP and   She also has the flecainide 100mg  2x day was prescribed 02-08-17 and pharmacist called PCP and changed medication sertraline 50mg  1xday   And the PCP wants Dr.Camnitz to be aware of changes in medication

## 2017-02-09 NOTE — Telephone Encounter (Signed)
Advised patient, verbalized understanding  

## 2017-02-17 ENCOUNTER — Ambulatory Visit (INDEPENDENT_AMBULATORY_CARE_PROVIDER_SITE_OTHER): Payer: Medicare Other

## 2017-02-17 DIAGNOSIS — E785 Hyperlipidemia, unspecified: Secondary | ICD-10-CM | POA: Diagnosis not present

## 2017-02-17 DIAGNOSIS — I471 Supraventricular tachycardia: Secondary | ICD-10-CM | POA: Diagnosis not present

## 2017-02-17 LAB — EXERCISE TOLERANCE TEST
Estimated workload: 9.1 METS
Exercise duration (min): 7 min
Exercise duration (sec): 24 s
MPHR: 158 {beats}/min
Peak HR: 117 {beats}/min
Percent HR: 74 %
RPE: 16
Rest HR: 51 {beats}/min

## 2017-02-23 ENCOUNTER — Other Ambulatory Visit: Payer: Self-pay | Admitting: Internal Medicine

## 2017-02-23 ENCOUNTER — Other Ambulatory Visit (HOSPITAL_COMMUNITY)
Admission: RE | Admit: 2017-02-23 | Discharge: 2017-02-23 | Disposition: A | Discharge status: Home / Self Care | Payer: Medicare Other | Source: Ambulatory Visit | Attending: Internal Medicine | Admitting: Internal Medicine

## 2017-02-23 ENCOUNTER — Ambulatory Visit
Admission: RE | Admit: 2017-02-23 | Discharge: 2017-02-23 | Disposition: A | Discharge status: Home / Self Care | Payer: Medicare Other | Source: Ambulatory Visit | Attending: Internal Medicine | Admitting: Internal Medicine

## 2017-02-23 DIAGNOSIS — I10 Essential (primary) hypertension: Secondary | ICD-10-CM | POA: Diagnosis not present

## 2017-02-23 DIAGNOSIS — M79605 Pain in left leg: Secondary | ICD-10-CM

## 2017-02-23 DIAGNOSIS — Z1151 Encounter for screening for human papillomavirus (HPV): Secondary | ICD-10-CM | POA: Insufficient documentation

## 2017-02-23 DIAGNOSIS — I471 Supraventricular tachycardia: Secondary | ICD-10-CM | POA: Diagnosis not present

## 2017-02-23 DIAGNOSIS — Z01419 Encounter for gynecological examination (general) (routine) without abnormal findings: Secondary | ICD-10-CM | POA: Insufficient documentation

## 2017-02-23 DIAGNOSIS — Z0000 Encounter for general adult medical examination without abnormal findings: Secondary | ICD-10-CM | POA: Diagnosis not present

## 2017-02-23 DIAGNOSIS — M79662 Pain in left lower leg: Secondary | ICD-10-CM | POA: Diagnosis not present

## 2017-02-23 DIAGNOSIS — M179 Osteoarthritis of knee, unspecified: Secondary | ICD-10-CM | POA: Diagnosis not present

## 2017-02-26 LAB — CYTOLOGY - PAP
Diagnosis: NEGATIVE
HPV: NOT DETECTED

## 2017-04-04 NOTE — Progress Notes (Signed)
Cardiology Office Note   Date:  04/05/2017   ID:  Sheri Lopez, DOB 05-Jul-1955, MRN [id number]  PCP:  [name], MD  Cardiologist:   Skeet Latch, MD   No chief complaint on file.   History of Present Illness: Sheri Lopez is a 61 y.o. female with hypertension, bradycardia, legal blindness, and breast cancer here for follow-up. She was initially seen 11/2016 for the evaluation of murmur.  She had previously seen her PCP and was noted to be bradycardic. Her heart rate was 44 on the ECG. She was also noted to have systolic murmur. Upon her clinical appointment with me no murmur was appreciated.  She was referred for an echo that revealed LVEF 55-60% and no significant valvular abnormalities. She also reported atypical chest pain and was referred for [facility] 12/09/16 where she was found to have normal systolic function and no ischemia.  She reported palpitations and had a 14 day event monitor that showed PACs, PVCs, and atrial tachycardia. She was noted to have both sinus rhythm and sinus bradycardia with an average heart rate of 62 bpm. There were no significant pauses.  At her last appointment [name] reported persistent shortness of breath.  She was referred for PFTs that showed minimal airway obstruction.  She was given a trial of albuterol.  Her BP was running low so HCTZ was reduced.  Her BP has been better since making that change.  She continued to have palpitations and was too bradycardic for nodal agents.  She was referred to Dr. Curt Bears and was to be a candidate for both ablation and antiarrhythmics.  She was started on metoprolol 12.5mg  and flecainide.  She initially noted a significant improvement in her palpitations after making this change.  This weekend she had more palpitations.  They lasted a couple minutes and were not associated with lightheadedness or dizziness.  It typically happens when she lies down and not with exertion.  She has not been feeling  dizzy or lightheaded as often.  She is frustrated by weight gain.  She has not been exercising much and notes that her diet is poor.  She walks twice per week but has not been walking lately because she stubbed her toe.  She is starting to work on portion sizes but needs guidance on choosing the best diet. She reports that her blood pressure has been well-controlled.  Past Medical History:  Diagnosis Date  . Allergic rhinitis   . Anxiety   . Atrial tachycardia ([place]) 01/27/2017  . Bradycardia   . Cancer [facility])    breast  . Essential hypertension 12/08/2016  . Fatigue 12/08/2016  . Frozen shoulder   . Heart murmur   . Hyperlipidemia 12/08/2016  . Hypertension   . Lower extremity edema 12/08/2016  . Palpitations 12/08/2016  . Psoriasis   . Retinitis pigmentosa   . Shortness of breath 12/08/2016  . Thyroid disease   . Venous insufficiency     Past Surgical History:  Procedure Laterality Date  . BREAST EXCISIONAL BIOPSY Right   . BREAST EXCISIONAL BIOPSY Left 2013     Current Outpatient Medications  Medication Sig Dispense Refill  . albuterol (PROVENTIL HFA;VENTOLIN HFA) 108 (90 Base) MCG/ACT inhaler Inhale 2 puffs into the lungs every 4 (four) hours as needed for shortness of breath. 1 Inhaler 2  . Azelastine-Fluticasone 137-50 MCG/ACT SUSP Place 1 puff into the nose 2 (two) times daily.    . cetirizine (ZYRTEC) 10 MG tablet Take 10  mg by mouth daily.    . clobetasol cream (TEMOVATE) 2.77 % Apply 1 application topically 2 (two) times daily. AS NEEDED    . flecainide (TAMBOCOR) 100 MG tablet Take 1 tablet (100 mg total) by mouth 2 (two) times daily. 180 tablet 3  . hydrochlorothiazide (HYDRODIURIL) 12.5 MG tablet 1/2 tablet by mouth daily 45 tablet 1  . ibuprofen (ADVIL,MOTRIN) 200 MG tablet Take 600 mg by mouth every 6 (six) hours as needed (tooth pain).    Marland Kitchen levothyroxine (SYNTHROID, LEVOTHROID) 75 MCG tablet Take 75 mcg by mouth daily before breakfast.    .  losartan-hydrochlorothiazide (HYZAAR) 100-12.5 MG tablet Take 1 tablet by mouth daily.    . metoprolol succinate (TOPROL XL) 25 MG 24 hr tablet Take 0.5 tablets (12.5 mg total) by mouth daily. 45 tablet 3  . NON FORMULARY Take by mouth daily. Plexus supplement    . sertraline (ZOLOFT) 50 MG tablet Take 50 mg by mouth daily.     No current facility-administered medications for this visit.     Allergies:   Keflex [cephalexin]    Social History:  The patient  reports that  has never smoked. she has never used smokeless tobacco.   Family History:  [name] history includes Arrhythmia in her mother; Breast cancer in her maternal aunt; CAD in her father; Cancer in her father and mother; Heart attack in her maternal grandfather and paternal grandfather; Heart failure in her paternal grandmother; Hyperlipidemia in her mother; Hypertension in her brother, mother, and sister.    ROS:  Please see the history of present illness.   Otherwise, review of systems are positive for none.   All other systems are reviewed and negative.    PHYSICAL EXAM: VS:  BP 120/82   Pulse (!) 53   Ht 5\' 8"  (1.727 m)   Wt 88.8 kg (195 lb 12.8 oz)   BMI 29.77 kg/m  , BMI Body mass index is 29.77 kg/m. GENERAL:  Well appearing HEENT: Pupils equal round and reactive, fundi not visualized, oral mucosa unremarkable NECK:  No jugular venous distention, waveform within normal limits, carotid upstroke brisk and symmetric, no bruits, no thyromegaly LUNGS:  Clear to auscultation bilaterally HEART:  RRR.  PMI not displaced or sustained,S1 and S2 within normal limits, no S3, no S4, no clicks, no rubs, no murmurs ABD:  Flat, positive bowel sounds normal in frequency in pitch, no bruits, no rebound, no guarding, no midline pulsatile mass, no hepatomegaly, no splenomegaly EXT:  2 plus pulses throughout, no edema, no cyanosis no clubbing SKIN:  No rashes no nodules NEURO:  Cranial nerves II through XII grossly intact,  motor grossly intact throughout PSYCH:  Cognitively intact, oriented to person place and time   EKG:  EKG is ordered today. The ekg ordered today demonstrates Sinus bradycardia. Rate 57 bpm. PVCs. Low-voltage 07/18/11: 11/10/16: Sinus bradycardia. Rate 48 bpm. 04/05/17: Sinus bradycardia.  Rate 53 bpm.  Low voltage precordial and limb leads.    Lexiscan Myoview 12/10/16::  Normal perfusion No ischemia  Nuclear stress EF: 59%.  Low risk study   Echo 12/21/16:  Study Conclusions  - Left ventricle: The cavity size was normal. Systolic function was   normal. The estimated ejection fraction was in the range of 55%   to 60%. Wall motion was normal; there were no regional wall   motion abnormalities. Left ventricular diastolic function   parameters were normal.  14 Day Event Monitor 01/04/17:  Quality: Fair.  Baseline  artifact. Predominant rhythm: sinus rhythm, sinus bradycardia Average heart rate: 62 bpm  Sinus rhythm and sinus bradycardia  PACs, PVCs Atrial tachycardia  Recent Labs: No results found for requested labs within last 8760 hours.   08/24/16: Sodium 141, potassium 4.1, BUN 17, creatinine 0.94 AST 16, ALT 15 TSH 2.54 Total cholesterol 229, triglycerides 139, HDL 79, LDL 123   Lipid Panel No results found for: CHOL, TRIG, HDL, CHOLHDL, VLDL, LDLCALC, LDLDIRECT    Wt Readings from Last 3 Encounters:  04/05/17 88.8 kg (195 lb 12.8 oz)  02/08/17 87.5 kg (192 lb 12.8 oz)  01/27/17 86.9 kg (191 lb 9.6 oz)      ASSESSMENT AND PLAN:  # Hypertension: BP well-controlled.  Continue metoprolol,  HCTZ and HCTZ/losartan.   and has been low at times.    # Atrial tachycardia: # PACs/PVCs: Overall palpitations are better-controlled.  She does not want an ablation and will continue on metoprolol and flecainide.  She has follow-up scheduled with Dr. Curt Bears tomorrow.  # Hyperlipidemia: ASCVD 10 year risk is 4.3%. No aspirin or statin at this time. Continue to work on  diet and exercise.   Current medicines are reviewed at length with the patient today.  The patient does not have concerns regarding medicines.  The following changes have been made:  none  Labs/ tests ordered today include:   No orders of the defined types were placed in this encounter.    Disposition:   FU with Arland Usery C. [name], MD, [facility] in 1 year.     This note was written with the assistance of speech recognition software.  Please excuse any transcriptional errors.  Signed, Kunta Hilleary C. [name], MD, [facility]  04/05/2017 1:22 PM    [facility]

## 2017-04-05 ENCOUNTER — Ambulatory Visit: Payer: Medicare Other | Admitting: Cardiovascular Disease

## 2017-04-05 ENCOUNTER — Encounter: Payer: Self-pay | Admitting: Cardiovascular Disease

## 2017-04-05 VITALS — BP 120/82 | HR 53 | Ht 68.0 in | Wt 195.8 lb

## 2017-04-05 DIAGNOSIS — I491 Atrial premature depolarization: Secondary | ICD-10-CM

## 2017-04-05 DIAGNOSIS — I471 Supraventricular tachycardia: Secondary | ICD-10-CM

## 2017-04-05 DIAGNOSIS — R001 Bradycardia, unspecified: Secondary | ICD-10-CM | POA: Diagnosis not present

## 2017-04-05 DIAGNOSIS — I10 Essential (primary) hypertension: Secondary | ICD-10-CM

## 2017-04-05 NOTE — Patient Instructions (Signed)

## 2017-04-06 ENCOUNTER — Ambulatory Visit (INDEPENDENT_AMBULATORY_CARE_PROVIDER_SITE_OTHER): Payer: Medicare Other | Admitting: Cardiology

## 2017-04-06 ENCOUNTER — Encounter: Payer: Self-pay | Admitting: Cardiology

## 2017-04-06 VITALS — BP 120/86 | HR 50 | Ht 68.0 in | Wt 196.0 lb

## 2017-04-06 DIAGNOSIS — E785 Hyperlipidemia, unspecified: Secondary | ICD-10-CM | POA: Diagnosis not present

## 2017-04-06 DIAGNOSIS — I471 Supraventricular tachycardia: Secondary | ICD-10-CM | POA: Diagnosis not present

## 2017-04-06 NOTE — Progress Notes (Signed)
Electrophysiology Office Note   Date:  04/06/2017   ID:  Sheri Lopez, DOB Feb 06, 1956, MRN [id number]  PCP:  [name], MD  Cardiologist:  [name] Primary Electrophysiologist:  [name] [name], MD    Chief Complaint  Patient presents with  . Follow-up    SVT     History of Present Illness: Sheri Lopez is a 61 y.o. female who is being seen today for the evaluation of SVT at the request of Schoenhoff, Altamese Cabal, *. Presenting today for electrophysiology evaluation. She has a history of hypertension, bradycardia, legal blindness, and breast cancer. She was noted. Bradycardic by her PCP with heart rates of 44 on EKG. She has had an echo that showed normal systolic function and a Myoview with normal function and no ischemia. She had palpitations and wore a 14 day monitor that showed PACs, PVCs and atrial tachycardia. Her average heart rate was 62.  She was put on both metoprolol and flecainide at her last visit.  Today, denies symptoms of chest pain, shortness of breath, orthopnea, PND, lower extremity edema, claudication, dizziness, presyncope, syncope, bleeding, or neurologic sequela. The patient is tolerating medications without difficulties.  Her episodes of palpitations have become much more rare.  She does have occasional palpitations when she lies down to relax or takes a nap.  These are much less severe than they were prior to initiation of flecainide.  Past Medical History:  Diagnosis Date  . Allergic rhinitis   . Anxiety   . Atrial tachycardia ([place]) 01/27/2017  . Bradycardia   . Cancer [facility])    breast  . Essential hypertension 12/08/2016  . Fatigue 12/08/2016  . Frozen shoulder   . Heart murmur   . Hyperlipidemia 12/08/2016  . Hypertension   . Lower extremity edema 12/08/2016  . Palpitations 12/08/2016  . Psoriasis   . Retinitis pigmentosa   . Shortness of breath 12/08/2016  . Thyroid disease   . Venous insufficiency    Past Surgical History:  Procedure  Laterality Date  . BREAST EXCISIONAL BIOPSY Right   . BREAST EXCISIONAL BIOPSY Left 2013     Current Outpatient Medications  Medication Sig Dispense Refill  . albuterol (PROVENTIL HFA;VENTOLIN HFA) 108 (90 Base) MCG/ACT inhaler Inhale 2 puffs into the lungs every 4 (four) hours as needed for shortness of breath. 1 Inhaler 2  . Azelastine-Fluticasone 137-50 MCG/ACT SUSP Place 1 puff into the nose 2 (two) times daily.    . cetirizine (ZYRTEC) 10 MG tablet Take 10 mg by mouth daily.    . clobetasol cream (TEMOVATE) 9.38 % Apply 1 application topically 2 (two) times daily. AS NEEDED    . flecainide (TAMBOCOR) 100 MG tablet Take 1 tablet (100 mg total) by mouth 2 (two) times daily. 180 tablet 3  . hydrochlorothiazide (HYDRODIURIL) 12.5 MG tablet 1/2 tablet by mouth daily 45 tablet 1  . ibuprofen (ADVIL,MOTRIN) 200 MG tablet Take 600 mg by mouth every 6 (six) hours as needed (tooth pain).    Marland Kitchen levothyroxine (SYNTHROID, LEVOTHROID) 75 MCG tablet Take 75 mcg by mouth daily before breakfast.    . losartan-hydrochlorothiazide (HYZAAR) 100-12.5 MG tablet Take 1 tablet by mouth daily.    . metoprolol succinate (TOPROL XL) 25 MG 24 hr tablet Take 0.5 tablets (12.5 mg total) by mouth daily. 45 tablet 3  . NON FORMULARY Take by mouth daily. Plexus supplement    . ranitidine (ZANTAC) 150 MG tablet Take 150 mg daily as needed by mouth for heartburn.    Marland Kitchen  sertraline (ZOLOFT) 50 MG tablet Take 50 mg by mouth daily.     No current facility-administered medications for this visit.     Allergies:   Keflex [cephalexin]   Social History:  The patient  reports that  has never smoked. she has never used smokeless tobacco.   Family History:  [name] history includes Arrhythmia in her mother; Breast cancer in her maternal aunt; CAD in her father; Cancer in her father and mother; Heart attack in her maternal grandfather and paternal grandfather; Heart failure in her paternal grandmother; Hyperlipidemia  in her mother; Hypertension in her brother, mother, and sister.    ROS:  Please see the history of present illness.   Otherwise, review of systems is positive for palpitations.   All other systems are reviewed and negative.   PHYSICAL EXAM: VS:  BP 120/86   Pulse (!) 50   Ht 5\' 8"  (1.727 m)   Wt 196 lb (88.9 kg)   BMI 29.80 kg/m  , BMI Body mass index is 29.8 kg/m. GEN: Well nourished, well developed, in no acute distress  HEENT: normal  Neck: no JVD, carotid bruits, or masses Cardiac: RRR; no murmurs, rubs, or gallops,no edema  Respiratory:  clear to auscultation bilaterally, normal work of breathing GI: soft, nontender, nondistended, + BS MS: no deformity or atrophy  Skin: warm and dry Neuro:  Strength and sensation are intact Psych: euthymic mood, full affect  EKG:  EKG is ordered today. Personal review of the ekg ordered 04/05/17 shows sinus rhythm, low voltage, rate 53  Recent Labs: No results found for requested labs within last 8760 hours.    Lipid Panel  No results found for: CHOL, TRIG, HDL, CHOLHDL, VLDL, LDLCALC, LDLDIRECT   Wt Readings from Last 3 Encounters:  04/06/17 196 lb (88.9 kg)  04/05/17 195 lb 12.8 oz (88.8 kg)  02/08/17 192 lb 12.8 oz (87.5 kg)      Other studies Reviewed: Additional studies/ records that were reviewed today include: Myoview 12/10/16  Review of the above records today demonstrates:   Normal perfusion No ischemia  Nuclear stress EF: 59%.  Low risk study  TTE 12/21/16 - Left ventricle: The cavity size was normal. Systolic function was   normal. The estimated ejection fraction was in the range of 55%   to 60%. Wall motion was normal; there were no regional wall   motion abnormalities. Left ventricular diastolic function   parameters were normal.  Cardiac monitor 01/26/17 - personally reviewed Predominant rhythm: sinus rhythm, sinus bradycardia Average heart rate: 62 bpm  Sinus rhythm and sinus bradycardia  PACs,  PVCs Atrial tachycardia  ETT 02/17/17  Blood pressure demonstrated a normal response to exercise.  Nonspecific ST changes noted during exercise. Immediate recovery there was 28mm of horizontal ST segment depression in the inferolateral leads  Abnormal exercise stress test in recovery. The patient had no symptoms during the stress test.  Submaximal ETT with patient achieving only 74% maximum predicted heart rate. The patient achieved 9.1 mets.  Recommed pharmacolgic myocardial perfusion scan.  ASSESSMENT AND PLAN:  1.  Atrial tachycardia: Was put on both metoprolol and flecainide at her last visit.  She is felt much better since being put on these medications.  She continues to have palpitations, but they are much more rare.  She would like to continue these medications at this time and Reshad Saab put off ablation.  2. Shortness of breath: Has been improving.  No changes.  3. Hyperlipidemia: Diet and  exercise control  Current medicines are reviewed at length with the patient today.   The patient does not have concerns regarding her medicines.  The following changes were made today: None  Labs/ tests ordered today include:  No orders of the defined types were placed in this encounter.    Disposition:   FU with Laylani Pudwill 6 months  Signed, Sharni Negron [name], MD  04/06/2017 9:50 AM     [facility] [address] [place] [address] [phone] (office) [phone] (fax)

## 2017-04-06 NOTE — Patient Instructions (Signed)
Medication Instructions:  Your physician recommends that you continue on your current medications as directed. Please refer to the Current Medication list given to you today.  If you need a refill on your cardiac medications before your next appointment, please call your pharmacy.   Labwork: None ordered  Testing/Procedures: None ordered  Follow-Up: Your physician wants you to follow-up in: 6 months with Dr. Camnitz.  You will receive a reminder letter in the mail two months in advance. If you don't receive a letter, please call our office to schedule the follow-up appointment.  Thank you for choosing [facility]!!   Cervando Durnin, RN [phone]         

## 2017-05-31 DIAGNOSIS — H6503 Acute serous otitis media, bilateral: Secondary | ICD-10-CM | POA: Diagnosis not present

## 2017-05-31 DIAGNOSIS — H9193 Unspecified hearing loss, bilateral: Secondary | ICD-10-CM | POA: Diagnosis not present

## 2017-06-09 DIAGNOSIS — J3089 Other allergic rhinitis: Secondary | ICD-10-CM | POA: Diagnosis not present

## 2017-06-09 DIAGNOSIS — H903 Sensorineural hearing loss, bilateral: Secondary | ICD-10-CM | POA: Diagnosis not present

## 2017-06-09 DIAGNOSIS — G43009 Migraine without aura, not intractable, without status migrainosus: Secondary | ICD-10-CM | POA: Diagnosis not present

## 2017-06-09 DIAGNOSIS — K219 Gastro-esophageal reflux disease without esophagitis: Secondary | ICD-10-CM | POA: Diagnosis not present

## 2017-07-05 DIAGNOSIS — I10 Essential (primary) hypertension: Secondary | ICD-10-CM | POA: Diagnosis not present

## 2017-07-05 DIAGNOSIS — J301 Allergic rhinitis due to pollen: Secondary | ICD-10-CM | POA: Diagnosis not present

## 2017-07-05 DIAGNOSIS — H9193 Unspecified hearing loss, bilateral: Secondary | ICD-10-CM | POA: Diagnosis not present

## 2017-07-06 DIAGNOSIS — J301 Allergic rhinitis due to pollen: Secondary | ICD-10-CM | POA: Diagnosis not present

## 2017-07-06 DIAGNOSIS — J3081 Allergic rhinitis due to animal (cat) (dog) hair and dander: Secondary | ICD-10-CM | POA: Diagnosis not present

## 2017-07-06 DIAGNOSIS — J3089 Other allergic rhinitis: Secondary | ICD-10-CM | POA: Diagnosis not present

## 2017-07-06 DIAGNOSIS — J4520 Mild intermittent asthma, uncomplicated: Secondary | ICD-10-CM | POA: Diagnosis not present

## 2017-07-11 ENCOUNTER — Other Ambulatory Visit: Payer: Self-pay | Admitting: Cardiovascular Disease

## 2017-07-12 NOTE — Telephone Encounter (Signed)
Please review for refill, Thanks !  

## 2017-07-12 NOTE — Telephone Encounter (Signed)
REFILL 

## 2017-08-12 ENCOUNTER — Ambulatory Visit (HOSPITAL_COMMUNITY)
Admission: EM | Admit: 2017-08-12 | Discharge: 2017-08-12 | Disposition: A | Discharge status: Home / Self Care | Payer: Medicare Other | Attending: Internal Medicine | Admitting: Internal Medicine

## 2017-08-12 ENCOUNTER — Ambulatory Visit (INDEPENDENT_AMBULATORY_CARE_PROVIDER_SITE_OTHER): Payer: Medicare Other

## 2017-08-12 ENCOUNTER — Encounter (HOSPITAL_COMMUNITY): Payer: Self-pay | Admitting: Emergency Medicine

## 2017-08-12 DIAGNOSIS — M25571 Pain in right ankle and joints of right foot: Secondary | ICD-10-CM

## 2017-08-12 DIAGNOSIS — M7989 Other specified soft tissue disorders: Secondary | ICD-10-CM | POA: Diagnosis not present

## 2017-08-12 DIAGNOSIS — W108XXA Fall (on) (from) other stairs and steps, initial encounter: Secondary | ICD-10-CM | POA: Diagnosis not present

## 2017-08-12 DIAGNOSIS — S90511A Abrasion, right ankle, initial encounter: Secondary | ICD-10-CM | POA: Diagnosis not present

## 2017-08-12 MED ORDER — MELOXICAM 7.5 MG PO TABS: 7.5000 mg | ORAL_TABLET | Freq: Every day | ORAL | 0 refills | Status: DC

## 2017-08-12 NOTE — Discharge Instructions (Signed)
Mobic as directed for pain and inflammation.  Ice compress, elevation.  You can use Ace wrap while elevating leg to help compression and reduce swelling.  Cam walker during activity.  Follow-up with orthopedics for further evaluation if symptoms not improving.

## 2017-08-12 NOTE — ED Triage Notes (Signed)
PT missed a step and rolled right ankle. Area is bruised and swollen. PT was able to bear weight on it earlier.

## 2017-08-12 NOTE — ED Provider Notes (Signed)
[facility]    CSN: [id number] Arrival date & time: 08/12/17  1817     History   Chief Complaint Chief Complaint  Patient presents with  . Ankle Pain    HPI Sheri Lopez is a 62 y.o. female.   62 year old female comes in for 1 day history of right ankle pain after injury.  States that she missed a step and rolled her right ankle, and landed on the dorsal aspect of the ankle.  She had bruising and swelling to the area.  She was able to bear weight immediately after the incident, but has gradually worsened painful weightbearing.  She has been applying ice compress without relief.  Has not taken anything for the symptoms.      Past Medical History:  Diagnosis Date  . Allergic rhinitis   . Anxiety   . Atrial tachycardia ([place]) 01/27/2017  . Bradycardia   . Cancer [facility])    breast  . Essential hypertension 12/08/2016  . Fatigue 12/08/2016  . Frozen shoulder   . Heart murmur   . Hyperlipidemia 12/08/2016  . Hypertension   . Lower extremity edema 12/08/2016  . Palpitations 12/08/2016  . Psoriasis   . Retinitis pigmentosa   . Shortness of breath 12/08/2016  . Thyroid disease   . Venous insufficiency     Patient Active Problem List   Diagnosis Date Noted  . Atrial tachycardia ([place]) 01/27/2017  . Essential hypertension 12/08/2016  . Hyperlipidemia 12/08/2016  . Bradycardia 12/08/2016  . Palpitations 12/08/2016  . Lower extremity edema 12/08/2016  . Fatigue 12/08/2016  . Shortness of breath 12/08/2016    Past Surgical History:  Procedure Laterality Date  . BREAST EXCISIONAL BIOPSY Right   . BREAST EXCISIONAL BIOPSY Left 2013    OB History    No data available       Home Medications    Prior to Admission medications   Medication Sig Start Date End Date Taking? Authorizing Provider  UNKNOWN TO PATIENT    Yes [provider]  albuterol (PROVENTIL HFA;VENTOLIN HFA) 108 (90 Base) MCG/ACT inhaler Inhale 2 puffs into the lungs every 4 (four) hours  as needed for shortness of breath. 02/09/17   Skeet Latch, MD  Azelastine-Fluticasone 137-50 MCG/ACT SUSP Place 1 puff into the nose 2 (two) times daily.    [provider]  clobetasol cream (TEMOVATE) 8.46 % Apply 1 application topically 2 (two) times daily. AS NEEDED    [provider]  flecainide (TAMBOCOR) 100 MG tablet Take 1 tablet (100 mg total) by mouth 2 (two) times daily. 02/08/17   Camnitz, Will Hassell Done, MD  hydrochlorothiazide (HYDRODIURIL) 12.5 MG tablet TAKE ONE-HALF TABLET BY MOUTH DAILY 07/12/17   Skeet Latch, MD  ibuprofen (ADVIL,MOTRIN) 200 MG tablet Take 600 mg by mouth every 6 (six) hours as needed (tooth pain).    [provider]  levothyroxine (SYNTHROID, LEVOTHROID) 75 MCG tablet Take 75 mcg by mouth daily before breakfast.    [provider]  losartan-hydrochlorothiazide (HYZAAR) 100-12.5 MG tablet Take 1 tablet by mouth daily.    [provider]  meloxicam (MOBIC) 7.5 MG tablet Take 1 tablet (7.5 mg total) by mouth daily. 08/12/17   Tasia Catchings, Chaunce Winkels V, PA-C  metoprolol succinate (TOPROL XL) 25 MG 24 hr tablet Take 0.5 tablets (12.5 mg total) by mouth daily. 02/08/17   Camnitz, Ocie Doyne, MD  NON FORMULARY Take by mouth daily. Plexus supplement    [provider]  ranitidine (ZANTAC) 150 MG capsule  Take 150 mg by mouth 2 (two) times daily.    [provider]  sertraline (ZOLOFT) 50 MG tablet Take 50 mg by mouth daily.    [provider]    Family History Family History  Problem Relation Age of Onset  . Breast cancer Maternal Aunt   . Cancer Mother   . Hyperlipidemia Mother   . Hypertension Mother   . Arrhythmia Mother   . Cancer Father   . CAD Father   . Heart attack Maternal Grandfather   . Heart failure Paternal Grandmother   . Heart attack Paternal Grandfather   . Hypertension Sister   . Hypertension Brother     Social History Social History   Tobacco Use  . Smoking status: Never  Smoker  . Smokeless tobacco: Never Used  Substance Use Topics  . Alcohol use: Not on file  . Drug use: Not on file     Allergies   Keflex [cephalexin]   Review of Systems Review of Systems  Reason unable to perform ROS: See HPI as above.     Physical Exam Triage Vital Signs ED Triage Vitals  Enc Vitals Group     BP 08/12/17 1955 (!) 123/51     Pulse Rate 08/12/17 1955 60     Resp 08/12/17 1955 16     Temp 08/12/17 1955 98.8 F (37.1 C)     Temp Source 08/12/17 1955 Oral     SpO2 08/12/17 1955 100 %     Weight 08/12/17 1954 195 lb (88.5 kg)     Height --      Head Circumference --      Peak Flow --      Pain Score 08/12/17 1954 9     Pain Loc --      Pain Edu? --      Excl. in [place]? --    No data found.  Updated Vital Signs BP (!) 123/51   Pulse 60   Temp 98.8 F (37.1 C) (Oral)   Resp 16   Wt 195 lb (88.5 kg)   SpO2 100%   BMI 29.65 kg/m   Physical Exam  Constitutional: She is oriented to person, place, and time. She appears well-developed and well-nourished. No distress.  HENT:  Head: Normocephalic and atraumatic.  Eyes: Conjunctivae are normal. Pupils are equal, round, and reactive to light.  Musculoskeletal:  Abrasion to the dorsal aspect of the ankle.  Diffuse swelling of the ankle, most significant at the lateral side of the ankle.  Diffuse tenderness to palpation of dorsal, lateral, medial aspect of the ankle.  Tenderness to palpation of proximal third to fifth MTP.  Decreased range of motion of ankle.  Sensation intact.  Pedal pulse 2+ and equal bilaterally.  Cap refill less than 2 seconds.  Neurological: She is alert and oriented to person, place, and time.     UC Treatments / Results  Labs (all labs ordered are listed, but only abnormal results are displayed) Labs Reviewed - No data to display  EKG  EKG Interpretation None       Radiology Dg Ankle Complete Right  Result Date: 08/12/2017 CLINICAL DATA:  Pain following fall EXAM: RIGHT  ANKLE - COMPLETE 3+ VIEW COMPARISON:  None. FINDINGS: Frontal, oblique, and lateral views obtained. There is marked soft tissue swelling laterally and anteriorly. No evident fracture or joint effusion. There is no appreciable joint space narrowing or erosion. There is an inferior calcaneal spur. Ankle mortise appears intact. IMPRESSION:  Soft tissue swelling anteriorly and laterally. Question underlying ligamentous injury. No fracture is demonstrable. The mortise appears grossly intact. No appreciable joint space narrowing. There is an inferior calcaneal spur. Electronically Signed   By: Lowella Grip III M.D.   On: 08/12/2017 20:27    Procedures Procedures (including critical care time)  Medications Ordered in UC Medications - No data to display   Initial Impression / Assessment and Plan / UC Course  I have reviewed the triage vital signs and the nursing notes.  Pertinent labs & imaging results that were available during my care of the patient were reviewed by me and considered in my medical decision making (see chart for details).    Discussed x-ray results with patient.  Patient with impaired vision, worries with use of crutches.  Will use Cam walker, Ace wrap instead.  Mobic as directed.  Ice compress, elevation.  Follow-up with orthopedics if symptoms not improving.  Patient expresses understanding and agrees to plan.  Final Clinical Impressions(s) / UC Diagnoses   Final diagnoses:  Acute right ankle pain    ED Discharge Orders        Ordered    meloxicam (MOBIC) 7.5 MG tablet  Daily     08/12/17 2049       Ok Edwards, PA-C 08/12/17 2055

## 2017-08-26 ENCOUNTER — Encounter: Payer: Self-pay | Admitting: Cardiology

## 2017-08-31 ENCOUNTER — Other Ambulatory Visit: Payer: Self-pay | Admitting: Physician Assistant

## 2017-08-31 DIAGNOSIS — L219 Seborrheic dermatitis, unspecified: Secondary | ICD-10-CM | POA: Diagnosis not present

## 2017-08-31 DIAGNOSIS — D485 Neoplasm of uncertain behavior of skin: Secondary | ICD-10-CM | POA: Diagnosis not present

## 2017-08-31 DIAGNOSIS — D499 Neoplasm of unspecified behavior of unspecified site: Secondary | ICD-10-CM | POA: Diagnosis not present

## 2017-08-31 DIAGNOSIS — D229 Melanocytic nevi, unspecified: Secondary | ICD-10-CM | POA: Diagnosis not present

## 2017-08-31 DIAGNOSIS — L910 Hypertrophic scar: Secondary | ICD-10-CM | POA: Diagnosis not present

## 2017-09-03 DIAGNOSIS — M25571 Pain in right ankle and joints of right foot: Secondary | ICD-10-CM | POA: Diagnosis not present

## 2017-09-03 DIAGNOSIS — M25572 Pain in left ankle and joints of left foot: Secondary | ICD-10-CM | POA: Diagnosis not present

## 2017-09-06 DIAGNOSIS — M25371 Other instability, right ankle: Secondary | ICD-10-CM | POA: Diagnosis not present

## 2017-09-06 DIAGNOSIS — S93401D Sprain of unspecified ligament of right ankle, subsequent encounter: Secondary | ICD-10-CM | POA: Diagnosis not present

## 2017-09-06 DIAGNOSIS — M722 Plantar fascial fibromatosis: Secondary | ICD-10-CM | POA: Diagnosis not present

## 2017-09-06 DIAGNOSIS — M79672 Pain in left foot: Secondary | ICD-10-CM | POA: Diagnosis not present

## 2017-09-10 DIAGNOSIS — S93401D Sprain of unspecified ligament of right ankle, subsequent encounter: Secondary | ICD-10-CM | POA: Diagnosis not present

## 2017-09-10 DIAGNOSIS — M25371 Other instability, right ankle: Secondary | ICD-10-CM | POA: Diagnosis not present

## 2017-09-10 DIAGNOSIS — M722 Plantar fascial fibromatosis: Secondary | ICD-10-CM | POA: Diagnosis not present

## 2017-09-10 DIAGNOSIS — M79672 Pain in left foot: Secondary | ICD-10-CM | POA: Diagnosis not present

## 2017-09-13 DIAGNOSIS — M25371 Other instability, right ankle: Secondary | ICD-10-CM | POA: Diagnosis not present

## 2017-09-13 DIAGNOSIS — M722 Plantar fascial fibromatosis: Secondary | ICD-10-CM | POA: Diagnosis not present

## 2017-09-13 DIAGNOSIS — M79672 Pain in left foot: Secondary | ICD-10-CM | POA: Diagnosis not present

## 2017-09-13 DIAGNOSIS — S93401D Sprain of unspecified ligament of right ankle, subsequent encounter: Secondary | ICD-10-CM | POA: Diagnosis not present

## 2017-09-15 ENCOUNTER — Encounter: Payer: Self-pay | Admitting: Cardiology

## 2017-09-15 DIAGNOSIS — M25371 Other instability, right ankle: Secondary | ICD-10-CM | POA: Diagnosis not present

## 2017-09-15 DIAGNOSIS — S93401D Sprain of unspecified ligament of right ankle, subsequent encounter: Secondary | ICD-10-CM | POA: Diagnosis not present

## 2017-09-15 DIAGNOSIS — M79672 Pain in left foot: Secondary | ICD-10-CM | POA: Diagnosis not present

## 2017-09-15 DIAGNOSIS — M722 Plantar fascial fibromatosis: Secondary | ICD-10-CM | POA: Diagnosis not present

## 2017-09-21 DIAGNOSIS — M25371 Other instability, right ankle: Secondary | ICD-10-CM | POA: Diagnosis not present

## 2017-09-21 DIAGNOSIS — S93401D Sprain of unspecified ligament of right ankle, subsequent encounter: Secondary | ICD-10-CM | POA: Diagnosis not present

## 2017-09-21 DIAGNOSIS — M79672 Pain in left foot: Secondary | ICD-10-CM | POA: Diagnosis not present

## 2017-09-22 DIAGNOSIS — S93401D Sprain of unspecified ligament of right ankle, subsequent encounter: Secondary | ICD-10-CM | POA: Diagnosis not present

## 2017-09-22 DIAGNOSIS — M79672 Pain in left foot: Secondary | ICD-10-CM | POA: Diagnosis not present

## 2017-09-22 DIAGNOSIS — M722 Plantar fascial fibromatosis: Secondary | ICD-10-CM | POA: Diagnosis not present

## 2017-09-22 DIAGNOSIS — M25371 Other instability, right ankle: Secondary | ICD-10-CM | POA: Diagnosis not present

## 2017-09-27 DIAGNOSIS — M25371 Other instability, right ankle: Secondary | ICD-10-CM | POA: Diagnosis not present

## 2017-09-27 DIAGNOSIS — M79672 Pain in left foot: Secondary | ICD-10-CM | POA: Diagnosis not present

## 2017-09-27 DIAGNOSIS — M722 Plantar fascial fibromatosis: Secondary | ICD-10-CM | POA: Diagnosis not present

## 2017-09-27 DIAGNOSIS — S93401D Sprain of unspecified ligament of right ankle, subsequent encounter: Secondary | ICD-10-CM | POA: Diagnosis not present

## 2017-09-30 ENCOUNTER — Other Ambulatory Visit: Payer: Self-pay | Admitting: Physician Assistant

## 2017-09-30 DIAGNOSIS — L988 Other specified disorders of the skin and subcutaneous tissue: Secondary | ICD-10-CM | POA: Diagnosis not present

## 2017-09-30 DIAGNOSIS — D2361 Other benign neoplasm of skin of right upper limb, including shoulder: Secondary | ICD-10-CM | POA: Diagnosis not present

## 2017-10-01 ENCOUNTER — Ambulatory Visit: Payer: Medicare Other | Admitting: Cardiology

## 2017-10-01 DIAGNOSIS — M79672 Pain in left foot: Secondary | ICD-10-CM | POA: Diagnosis not present

## 2017-10-01 DIAGNOSIS — M25371 Other instability, right ankle: Secondary | ICD-10-CM | POA: Diagnosis not present

## 2017-10-01 DIAGNOSIS — S93401D Sprain of unspecified ligament of right ankle, subsequent encounter: Secondary | ICD-10-CM | POA: Diagnosis not present

## 2017-10-01 DIAGNOSIS — M722 Plantar fascial fibromatosis: Secondary | ICD-10-CM | POA: Diagnosis not present

## 2017-10-04 DIAGNOSIS — M25371 Other instability, right ankle: Secondary | ICD-10-CM | POA: Diagnosis not present

## 2017-10-04 DIAGNOSIS — S93401D Sprain of unspecified ligament of right ankle, subsequent encounter: Secondary | ICD-10-CM | POA: Diagnosis not present

## 2017-10-04 DIAGNOSIS — M722 Plantar fascial fibromatosis: Secondary | ICD-10-CM | POA: Diagnosis not present

## 2017-10-04 DIAGNOSIS — M79672 Pain in left foot: Secondary | ICD-10-CM | POA: Diagnosis not present

## 2017-10-08 DIAGNOSIS — S93401D Sprain of unspecified ligament of right ankle, subsequent encounter: Secondary | ICD-10-CM | POA: Diagnosis not present

## 2017-10-08 DIAGNOSIS — M25371 Other instability, right ankle: Secondary | ICD-10-CM | POA: Diagnosis not present

## 2017-10-08 DIAGNOSIS — M79672 Pain in left foot: Secondary | ICD-10-CM | POA: Diagnosis not present

## 2017-10-08 DIAGNOSIS — M722 Plantar fascial fibromatosis: Secondary | ICD-10-CM | POA: Diagnosis not present

## 2017-10-11 DIAGNOSIS — M722 Plantar fascial fibromatosis: Secondary | ICD-10-CM | POA: Diagnosis not present

## 2017-10-11 DIAGNOSIS — M79672 Pain in left foot: Secondary | ICD-10-CM | POA: Diagnosis not present

## 2017-10-11 DIAGNOSIS — S93401D Sprain of unspecified ligament of right ankle, subsequent encounter: Secondary | ICD-10-CM | POA: Diagnosis not present

## 2017-10-11 DIAGNOSIS — M25371 Other instability, right ankle: Secondary | ICD-10-CM | POA: Diagnosis not present

## 2017-10-12 DIAGNOSIS — H2703 Aphakia, bilateral: Secondary | ICD-10-CM | POA: Diagnosis not present

## 2017-10-12 DIAGNOSIS — Z9849 Cataract extraction status, unspecified eye: Secondary | ICD-10-CM | POA: Diagnosis not present

## 2017-10-12 DIAGNOSIS — H3552 Pigmentary retinal dystrophy: Secondary | ICD-10-CM | POA: Diagnosis not present

## 2017-10-12 DIAGNOSIS — H50111 Monocular exotropia, right eye: Secondary | ICD-10-CM | POA: Diagnosis not present

## 2017-10-13 DIAGNOSIS — M722 Plantar fascial fibromatosis: Secondary | ICD-10-CM | POA: Diagnosis not present

## 2017-10-13 DIAGNOSIS — M25371 Other instability, right ankle: Secondary | ICD-10-CM | POA: Diagnosis not present

## 2017-10-13 DIAGNOSIS — S93401D Sprain of unspecified ligament of right ankle, subsequent encounter: Secondary | ICD-10-CM | POA: Diagnosis not present

## 2017-10-13 DIAGNOSIS — M79672 Pain in left foot: Secondary | ICD-10-CM | POA: Diagnosis not present

## 2017-10-18 DIAGNOSIS — S93401D Sprain of unspecified ligament of right ankle, subsequent encounter: Secondary | ICD-10-CM | POA: Diagnosis not present

## 2017-10-18 DIAGNOSIS — M25371 Other instability, right ankle: Secondary | ICD-10-CM | POA: Diagnosis not present

## 2017-10-18 DIAGNOSIS — M79672 Pain in left foot: Secondary | ICD-10-CM | POA: Diagnosis not present

## 2017-10-18 DIAGNOSIS — M722 Plantar fascial fibromatosis: Secondary | ICD-10-CM | POA: Diagnosis not present

## 2017-10-20 DIAGNOSIS — M79672 Pain in left foot: Secondary | ICD-10-CM | POA: Diagnosis not present

## 2017-10-20 DIAGNOSIS — M25371 Other instability, right ankle: Secondary | ICD-10-CM | POA: Diagnosis not present

## 2017-10-20 DIAGNOSIS — M722 Plantar fascial fibromatosis: Secondary | ICD-10-CM | POA: Diagnosis not present

## 2017-10-20 DIAGNOSIS — S93401D Sprain of unspecified ligament of right ankle, subsequent encounter: Secondary | ICD-10-CM | POA: Diagnosis not present

## 2017-10-26 ENCOUNTER — Ambulatory Visit: Payer: Medicare Other | Admitting: Cardiology

## 2017-10-26 DIAGNOSIS — S93401D Sprain of unspecified ligament of right ankle, subsequent encounter: Secondary | ICD-10-CM | POA: Diagnosis not present

## 2017-10-26 DIAGNOSIS — M25671 Stiffness of right ankle, not elsewhere classified: Secondary | ICD-10-CM | POA: Diagnosis not present

## 2017-10-26 DIAGNOSIS — M79672 Pain in left foot: Secondary | ICD-10-CM | POA: Diagnosis not present

## 2017-10-26 DIAGNOSIS — M722 Plantar fascial fibromatosis: Secondary | ICD-10-CM | POA: Diagnosis not present

## 2017-10-26 NOTE — Progress Notes (Deleted)
Electrophysiology Office Note   Date:  10/26/2017   ID:  Sheri Lopez, DOB [birth year], MRN [id number]  PCP:  [name], MD  Cardiologist:  [name] Primary Electrophysiologist:  Caleb Decock [name], MD    No chief complaint on file.    History of Present Illness: Sheri Lopez is a 62 y.o. female who is being seen today for the evaluation of SVT at the request of Schoenhoff, Altamese Cabal, *. Presenting today for electrophysiology evaluation. She has a history of hypertension, bradycardia, legal blindness, and breast cancer. She was noted. Bradycardic by her PCP with heart rates of 44 on EKG. She has had an echo that showed normal systolic function and a Myoview with normal function and no ischemia. She had palpitations and wore a 14 day monitor that showed PACs, PVCs and atrial tachycardia. Her average heart rate was 62.  She was put on both metoprolol and flecainide at her last visit.  Today, denies symptoms of palpitations, chest pain, shortness of breath, orthopnea, PND, lower extremity edema, claudication, dizziness, presyncope, syncope, bleeding, or neurologic sequela. The patient is tolerating medications without difficulties. ***   Past Medical History:  Diagnosis Date  . Allergic rhinitis   . Anxiety   . Atrial tachycardia ([place]) 01/27/2017  . Bradycardia   . Cancer [facility])    breast  . Essential hypertension 12/08/2016  . Fatigue 12/08/2016  . Frozen shoulder   . Heart murmur   . Hyperlipidemia 12/08/2016  . Hypertension   . Lower extremity edema 12/08/2016  . Palpitations 12/08/2016  . Psoriasis   . Retinitis pigmentosa   . Shortness of breath 12/08/2016  . Thyroid disease   . Venous insufficiency    Past Surgical History:  Procedure Laterality Date  . BREAST EXCISIONAL BIOPSY Right   . BREAST EXCISIONAL BIOPSY Left 2013     Current Outpatient Medications  Medication Sig Dispense Refill  . albuterol (PROVENTIL HFA;VENTOLIN HFA) 108 (90 Base) MCG/ACT inhaler  Inhale 2 puffs into the lungs every 4 (four) hours as needed for shortness of breath. 1 Inhaler 2  . Azelastine-Fluticasone 137-50 MCG/ACT SUSP Place 1 puff into the nose 2 (two) times daily.    . clobetasol cream (TEMOVATE) 4.09 % Apply 1 application topically 2 (two) times daily. AS NEEDED    . flecainide (TAMBOCOR) 100 MG tablet Take 1 tablet (100 mg total) by mouth 2 (two) times daily. 180 tablet 3  . hydrochlorothiazide (HYDRODIURIL) 12.5 MG tablet TAKE ONE-HALF TABLET BY MOUTH DAILY 45 tablet 5  . ibuprofen (ADVIL,MOTRIN) 200 MG tablet Take 600 mg by mouth every 6 (six) hours as needed (tooth pain).    Marland Kitchen levothyroxine (SYNTHROID, LEVOTHROID) 75 MCG tablet Take 75 mcg by mouth daily before breakfast.    . losartan-hydrochlorothiazide (HYZAAR) 100-12.5 MG tablet Take 1 tablet by mouth daily.    . meloxicam (MOBIC) 7.5 MG tablet Take 1 tablet (7.5 mg total) by mouth daily. 15 tablet 0  . metoprolol succinate (TOPROL XL) 25 MG 24 hr tablet Take 0.5 tablets (12.5 mg total) by mouth daily. 45 tablet 3  . NON FORMULARY Take by mouth daily. Plexus supplement    . ranitidine (ZANTAC) 150 MG capsule Take 150 mg by mouth 2 (two) times daily.    . sertraline (ZOLOFT) 50 MG tablet Take 50 mg by mouth daily.    Marland Kitchen UNKNOWN TO PATIENT      No current facility-administered medications for this visit.     Allergies:   Keflex [cephalexin]  Social History:  The patient  reports that she has never smoked. She has never used smokeless tobacco.   Family History:  [name] history includes Arrhythmia in her mother; Breast cancer in her maternal aunt; CAD in her father; Cancer in her father and mother; Heart attack in her maternal grandfather and paternal grandfather; Heart failure in her paternal grandmother; Hyperlipidemia in her mother; Hypertension in her brother, mother, and sister.   ROS:  Please see the history of present illness.   Otherwise, review of systems is positive for ***.   All  other systems are reviewed and negative.   PHYSICAL EXAM: VS:  There were no vitals taken for this visit. , BMI There is no height or weight on file to calculate BMI. GEN: Well nourished, well developed, in no acute distress  HEENT: normal  Neck: no JVD, carotid bruits, or masses Cardiac: ***RRR; no murmurs, rubs, or gallops,no edema  Respiratory:  clear to auscultation bilaterally, normal work of breathing GI: soft, nontender, nondistended, + BS MS: no deformity or atrophy  Skin: warm and dry Neuro:  Strength and sensation are intact Psych: euthymic mood, full affect  EKG:  EKG {ACTION; IS/IS [id number]} ordered today. Personal review of the ekg ordered *** shows ***   Recent Labs: No results found for requested labs within last 8760 hours.    Lipid Panel  No results found for: CHOL, TRIG, HDL, CHOLHDL, VLDL, LDLCALC, LDLDIRECT   Wt Readings from Last 3 Encounters:  08/12/17 195 lb (88.5 kg)  04/06/17 196 lb (88.9 kg)  04/05/17 195 lb 12.8 oz (88.8 kg)      Other studies Reviewed: Additional studies/ records that were reviewed today include: Myoview 12/10/16  Review of the above records today demonstrates:   Normal perfusion No ischemia  Nuclear stress EF: 59%.  Low risk study  TTE 12/21/16 - Left ventricle: The cavity size was normal. Systolic function was   normal. The estimated ejection fraction was in the range of 55%   to 60%. Wall motion was normal; there were no regional wall   motion abnormalities. Left ventricular diastolic function   parameters were normal.  Cardiac monitor 01/26/17 - personally reviewed Predominant rhythm: sinus rhythm, sinus bradycardia Average heart rate: 62 bpm  Sinus rhythm and sinus bradycardia  PACs, PVCs Atrial tachycardia  ETT 02/17/17  Blood pressure demonstrated a normal response to exercise.  Nonspecific ST changes noted during exercise. Immediate recovery there was 55mm of horizontal ST segment depression in the  inferolateral leads  Abnormal exercise stress test in recovery. The patient had no symptoms during the stress test.  Submaximal ETT with patient achieving only 74% maximum predicted heart rate. The patient achieved 9.1 mets.  Recommed pharmacolgic myocardial perfusion scan.  ASSESSMENT AND PLAN:  1.  Atrial tachycardia: Was put on both metoprolol and flecainide at her last visit.  She is felt much better since being put on these medications.  She continues to have palpitations, but they are much more rare.  She would like to continue these medications at this time and Alekhya Gravlin put off ablation.***  2. Shortness of breath: Has been improving.  No changes.***  3. Hyperlipidemia: Diet and exercise control***  Current medicines are reviewed at length with the patient today.   The patient does not have concerns regarding her medicines.  The following changes were made today: ***  Labs/ tests ordered today include:  No orders of the defined types were placed in this encounter.  Disposition:   FU with Sharalee Witman *** months  Signed, Taiya Nutting [name], MD  10/26/2017 7:17 AM     [facility] [address]  [place] [address] [phone] (office) [phone] (fax)

## 2017-10-28 DIAGNOSIS — M25371 Other instability, right ankle: Secondary | ICD-10-CM | POA: Diagnosis not present

## 2017-10-28 DIAGNOSIS — M722 Plantar fascial fibromatosis: Secondary | ICD-10-CM | POA: Diagnosis not present

## 2017-10-28 DIAGNOSIS — M79672 Pain in left foot: Secondary | ICD-10-CM | POA: Diagnosis not present

## 2017-10-28 DIAGNOSIS — S93401D Sprain of unspecified ligament of right ankle, subsequent encounter: Secondary | ICD-10-CM | POA: Diagnosis not present

## 2017-11-01 DIAGNOSIS — M79672 Pain in left foot: Secondary | ICD-10-CM | POA: Diagnosis not present

## 2017-11-01 DIAGNOSIS — M722 Plantar fascial fibromatosis: Secondary | ICD-10-CM | POA: Diagnosis not present

## 2017-11-01 DIAGNOSIS — M25371 Other instability, right ankle: Secondary | ICD-10-CM | POA: Diagnosis not present

## 2017-11-01 DIAGNOSIS — S93401D Sprain of unspecified ligament of right ankle, subsequent encounter: Secondary | ICD-10-CM | POA: Diagnosis not present

## 2017-11-04 ENCOUNTER — Encounter: Payer: Self-pay | Admitting: Cardiology

## 2017-11-04 ENCOUNTER — Ambulatory Visit: Payer: Medicare Other | Admitting: Cardiology

## 2017-11-04 VITALS — BP 108/74 | HR 61 | Ht 68.0 in | Wt 193.0 lb

## 2017-11-04 DIAGNOSIS — I471 Supraventricular tachycardia: Secondary | ICD-10-CM

## 2017-11-04 DIAGNOSIS — E785 Hyperlipidemia, unspecified: Secondary | ICD-10-CM | POA: Diagnosis not present

## 2017-11-04 NOTE — Progress Notes (Signed)
Sheri Lopez   Date:  11/04/2017   ID:  Sheri Lopez, DOB 1955/12/02, MRN [id number]  PCP:  [name], MD  Cardiologist:  [name] Primary Electrophysiologist:  Rosangelica Pevehouse [name], MD    No chief complaint on file.    History of Present Illness: Sheri Lopez is a 62 y.o. female who is being seen today for the evaluation of SVT at the request of Schoenhoff, Altamese Cabal, *. Presenting today for Sheri evaluation. She has a history of hypertension, bradycardia, legal blindness, and breast cancer. She was noted. Bradycardic by her PCP with heart rates of 44 on EKG. She has had an echo that showed normal systolic function and a Myoview with normal function and no ischemia. She had palpitations and wore a 14 day monitor that showed PACs, PVCs and atrial tachycardia. Her average heart rate was 62.  She was put on both metoprolol and flecainide at her last visit.  Today, denies symptoms of palpitations, chest pain, shortness of breath, orthopnea, PND, lower extremity edema, claudication, dizziness, presyncope, syncope, bleeding, or neurologic sequela. The patient is tolerating medications without difficulties.  Overall she is doing well.  Her burden of palpitations is greatly been reduced.  She is tolerating the flecainide without issue.  She does have fatigue.  This is not occur on a daily basis.  She says that there are some days where all he wants to do his sleep.  Otherwise she is doing well.  Past Medical History:  Diagnosis Date  . Allergic rhinitis   . Anxiety   . Atrial tachycardia ([place]) 01/27/2017  . Bradycardia   . Cancer [facility])    breast  . Essential hypertension 12/08/2016  . Fatigue 12/08/2016  . Frozen shoulder   . Heart murmur   . Hyperlipidemia 12/08/2016  . Hypertension   . Lower extremity edema 12/08/2016  . Palpitations 12/08/2016  . Psoriasis   . Retinitis pigmentosa   . Shortness of breath 12/08/2016  . Thyroid disease   . Venous  insufficiency    Past Surgical History:  Procedure Laterality Date  . BREAST EXCISIONAL BIOPSY Right   . BREAST EXCISIONAL BIOPSY Left 2013     Current Outpatient Medications  Medication Sig Dispense Refill  . albuterol (PROVENTIL HFA;VENTOLIN HFA) 108 (90 Base) MCG/ACT inhaler Inhale 2 puffs into the lungs every 4 (four) hours as needed for shortness of breath. 1 Inhaler 2  . Azelastine-Fluticasone 137-50 MCG/ACT SUSP Place 1 puff into the nose 2 (two) times daily.    . clobetasol cream (TEMOVATE) 6.28 % Apply 1 application topically 2 (two) times daily. AS NEEDED    . flecainide (TAMBOCOR) 100 MG tablet Take 1 tablet (100 mg total) by mouth 2 (two) times daily. 180 tablet 3  . hydrochlorothiazide (HYDRODIURIL) 12.5 MG tablet TAKE ONE-HALF TABLET BY MOUTH DAILY 45 tablet 5  . ibuprofen (ADVIL,MOTRIN) 200 MG tablet Take 600 mg by mouth every 6 (six) hours as needed (tooth pain).    Marland Kitchen levothyroxine (SYNTHROID, LEVOTHROID) 75 MCG tablet Take 75 mcg by mouth daily before breakfast.    . losartan-hydrochlorothiazide (HYZAAR) 100-12.5 MG tablet Take 1 tablet by mouth daily.    . meloxicam (MOBIC) 7.5 MG tablet Take 1 tablet (7.5 mg total) by mouth daily. 15 tablet 0  . metoprolol succinate (TOPROL XL) 25 MG 24 hr tablet Take 0.5 tablets (12.5 mg total) by mouth daily. 45 tablet 3  . NON FORMULARY Take by mouth daily. Plexus supplement    .  ranitidine (ZANTAC) 150 MG capsule Take 150 mg by mouth 2 (two) times daily.    . sertraline (ZOLOFT) 50 MG tablet Take 50 mg by mouth daily.    Marland Kitchen UNKNOWN TO PATIENT      No current facility-administered medications for this visit.     Allergies:   Keflex [cephalexin]   Social History:  The patient  reports that she has never smoked. She has never used smokeless tobacco.   Family History:  [name] history includes Arrhythmia in her mother; Breast cancer in her maternal aunt; CAD in her father; Cancer in her father and mother; Heart attack  in her maternal grandfather and paternal grandfather; Heart failure in her paternal grandmother; Hyperlipidemia in her mother; Hypertension in her brother, mother, and sister.    ROS:  Please see the history of present illness.   Otherwise, review of systems is positive for none.   All other systems are reviewed and negative.   PHYSICAL EXAM: VS:  There were no vitals taken for this visit. , BMI There is no height or weight on file to calculate BMI. GEN: Well nourished, well developed, in no acute distress  HEENT: normal  Neck: no JVD, carotid bruits, or masses Cardiac: RRR; no murmurs, rubs, or gallops,no edema  Respiratory:  clear to auscultation bilaterally, normal work of breathing GI: soft, nontender, nondistended, + BS MS: no deformity or atrophy  Skin: warm and dry Neuro:  Strength and sensation are intact Psych: euthymic mood, full affect  EKG:  EKG is ordered today. Personal review of the ekg ordered shows [place] rhythm, rate 56  Recent Labs: No results found for requested labs within last 8760 hours.    Lipid Panel  No results found for: CHOL, TRIG, HDL, CHOLHDL, VLDL, LDLCALC, LDLDIRECT   Wt Readings from Last 3 Encounters:  08/12/17 195 lb (88.5 kg)  04/06/17 196 lb (88.9 kg)  04/05/17 195 lb 12.8 oz (88.8 kg)      Other studies Reviewed: Additional studies/ records that were reviewed today include: Myoview 12/10/16  Review of the above records today demonstrates:   Normal perfusion No ischemia  Nuclear stress EF: 59%.  Low risk study  TTE 12/21/16 - Left ventricle: The cavity size was normal. Systolic function was   normal. The estimated ejection fraction was in the range of 55%   to 60%. Wall motion was normal; there were no regional wall   motion abnormalities. Left ventricular diastolic function   parameters were normal.  Cardiac monitor 01/26/17 - personally reviewed Predominant rhythm: sinus rhythm, sinus bradycardia Average heart rate: 62  bpm  Sinus rhythm and sinus bradycardia  PACs, PVCs Atrial tachycardia  ETT 02/17/17  Blood pressure demonstrated a normal response to exercise.  Nonspecific ST changes noted during exercise. Immediate recovery there was 43mm of horizontal ST segment depression in the inferolateral leads  Abnormal exercise stress test in recovery. The patient had no symptoms during the stress test.  Submaximal ETT with patient achieving only 74% maximum predicted heart rate. The patient achieved 9.1 mets.  Recommed pharmacolgic myocardial perfusion scan.  ASSESSMENT AND PLAN:  1.  Atrial tachycardia: Currently on metoprolol and flecainide.  She is doing well with a reduction in her atrial tachycardia burden.  This point, as she is doing well, no changes.  2.  Fatigue: It appears that her fatigue is not due to medications it is not consistent on a daily basis.  That being said, if her fatigue continues, would switch  her from metoprolol to diltiazem.  3. Hyperlipidemia: Controlled with diet and exercise  Current medicines are reviewed at length with the patient today.   The patient does not have concerns regarding her medicines.  The following changes were made today: None  Labs/ tests ordered today include:  No orders of the defined types were placed in this encounter.    Disposition:   FU with Arlie Posch 6 months  Signed, Jassen Sarver [name], MD  11/04/2017 12:09 PM     [address] [place] [address] [phone] (office) [phone] (fax)

## 2017-11-04 NOTE — Patient Instructions (Signed)
Medication Instructions:  Your physician recommends that you continue on your current medications as directed. Please refer to the Current Medication list given to you today.  Labwork: None ordered     *We will only notify you of abnormal results, otherwise continue current treatment plan.  Testing/Procedures: None ordered  Follow-Up: Your physician wants you to follow-up in: 6 months  with Dr. Camnitz.  You will receive a reminder letter in the mail two months in advance. If you don't receive a letter, please call our office to schedule the follow-up appointment.   * If you need a refill on your cardiac medications before your next appointment, please call your pharmacy.   *Please note that any paperwork needing to be filled out by the provider will need to be addressed at the front desk prior to seeing the provider. Please note that any FMLA, disability or other documents regarding health condition is subject to a $25.00 charge that must be received prior to completion of paperwork in the form of a money order or check.  Thank you for choosing [facility]!!   Ceirra Belli, RN [phone]      

## 2017-11-05 ENCOUNTER — Other Ambulatory Visit: Payer: Self-pay | Admitting: Internal Medicine

## 2017-11-05 DIAGNOSIS — I10 Essential (primary) hypertension: Secondary | ICD-10-CM | POA: Diagnosis not present

## 2017-11-05 DIAGNOSIS — M722 Plantar fascial fibromatosis: Secondary | ICD-10-CM | POA: Diagnosis not present

## 2017-11-05 DIAGNOSIS — Z1231 Encounter for screening mammogram for malignant neoplasm of breast: Secondary | ICD-10-CM

## 2017-11-05 DIAGNOSIS — M25371 Other instability, right ankle: Secondary | ICD-10-CM | POA: Diagnosis not present

## 2017-11-05 DIAGNOSIS — E559 Vitamin D deficiency, unspecified: Secondary | ICD-10-CM | POA: Diagnosis not present

## 2017-11-05 DIAGNOSIS — E039 Hypothyroidism, unspecified: Secondary | ICD-10-CM | POA: Diagnosis not present

## 2017-11-05 DIAGNOSIS — Z0000 Encounter for general adult medical examination without abnormal findings: Secondary | ICD-10-CM | POA: Diagnosis not present

## 2017-11-05 DIAGNOSIS — S93401D Sprain of unspecified ligament of right ankle, subsequent encounter: Secondary | ICD-10-CM | POA: Diagnosis not present

## 2017-11-05 DIAGNOSIS — M79672 Pain in left foot: Secondary | ICD-10-CM | POA: Diagnosis not present

## 2017-11-08 DIAGNOSIS — M722 Plantar fascial fibromatosis: Secondary | ICD-10-CM | POA: Diagnosis not present

## 2017-11-08 DIAGNOSIS — M25371 Other instability, right ankle: Secondary | ICD-10-CM | POA: Diagnosis not present

## 2017-11-08 DIAGNOSIS — S93401D Sprain of unspecified ligament of right ankle, subsequent encounter: Secondary | ICD-10-CM | POA: Diagnosis not present

## 2017-11-08 DIAGNOSIS — M79672 Pain in left foot: Secondary | ICD-10-CM | POA: Diagnosis not present

## 2017-11-21 DIAGNOSIS — H6503 Acute serous otitis media, bilateral: Secondary | ICD-10-CM | POA: Diagnosis not present

## 2017-11-24 DIAGNOSIS — M25371 Other instability, right ankle: Secondary | ICD-10-CM | POA: Diagnosis not present

## 2017-12-06 ENCOUNTER — Ambulatory Visit
Admission: RE | Admit: 2017-12-06 | Discharge: 2017-12-06 | Disposition: A | Discharge status: Home / Self Care | Payer: Medicare Other | Source: Ambulatory Visit | Attending: Internal Medicine | Admitting: Internal Medicine

## 2017-12-06 DIAGNOSIS — Z1231 Encounter for screening mammogram for malignant neoplasm of breast: Secondary | ICD-10-CM | POA: Diagnosis not present

## 2018-01-04 DIAGNOSIS — R194 Change in bowel habit: Secondary | ICD-10-CM | POA: Diagnosis not present

## 2018-01-04 DIAGNOSIS — R1310 Dysphagia, unspecified: Secondary | ICD-10-CM | POA: Diagnosis not present

## 2018-01-07 ENCOUNTER — Other Ambulatory Visit: Payer: Self-pay | Admitting: Cardiology

## 2018-01-11 DIAGNOSIS — L409 Psoriasis, unspecified: Secondary | ICD-10-CM | POA: Diagnosis not present

## 2018-01-11 DIAGNOSIS — D229 Melanocytic nevi, unspecified: Secondary | ICD-10-CM | POA: Diagnosis not present

## 2018-01-27 DIAGNOSIS — K2980 Duodenitis without bleeding: Secondary | ICD-10-CM | POA: Diagnosis not present

## 2018-01-27 DIAGNOSIS — K2930 Chronic superficial gastritis without bleeding: Secondary | ICD-10-CM | POA: Diagnosis not present

## 2018-01-27 DIAGNOSIS — K219 Gastro-esophageal reflux disease without esophagitis: Secondary | ICD-10-CM | POA: Diagnosis not present

## 2018-01-27 DIAGNOSIS — D126 Benign neoplasm of colon, unspecified: Secondary | ICD-10-CM | POA: Diagnosis not present

## 2018-02-02 DIAGNOSIS — K219 Gastro-esophageal reflux disease without esophagitis: Secondary | ICD-10-CM | POA: Diagnosis not present

## 2018-02-02 DIAGNOSIS — D126 Benign neoplasm of colon, unspecified: Secondary | ICD-10-CM | POA: Diagnosis not present

## 2018-02-02 DIAGNOSIS — K2930 Chronic superficial gastritis without bleeding: Secondary | ICD-10-CM | POA: Diagnosis not present

## 2018-02-02 DIAGNOSIS — K2980 Duodenitis without bleeding: Secondary | ICD-10-CM | POA: Diagnosis not present

## 2018-02-21 DIAGNOSIS — J019 Acute sinusitis, unspecified: Secondary | ICD-10-CM | POA: Diagnosis not present

## 2018-02-21 DIAGNOSIS — J4520 Mild intermittent asthma, uncomplicated: Secondary | ICD-10-CM | POA: Diagnosis not present

## 2018-02-21 DIAGNOSIS — J3081 Allergic rhinitis due to animal (cat) (dog) hair and dander: Secondary | ICD-10-CM | POA: Diagnosis not present

## 2018-02-21 DIAGNOSIS — J301 Allergic rhinitis due to pollen: Secondary | ICD-10-CM | POA: Diagnosis not present

## 2018-03-22 DIAGNOSIS — Z23 Encounter for immunization: Secondary | ICD-10-CM | POA: Diagnosis not present

## 2018-04-12 ENCOUNTER — Other Ambulatory Visit: Payer: Self-pay | Admitting: Physician Assistant

## 2018-04-12 DIAGNOSIS — L409 Psoriasis, unspecified: Secondary | ICD-10-CM | POA: Diagnosis not present

## 2018-04-12 DIAGNOSIS — D485 Neoplasm of uncertain behavior of skin: Secondary | ICD-10-CM | POA: Diagnosis not present

## 2018-04-12 DIAGNOSIS — L989 Disorder of the skin and subcutaneous tissue, unspecified: Secondary | ICD-10-CM | POA: Diagnosis not present

## 2018-05-12 ENCOUNTER — Other Ambulatory Visit: Payer: Self-pay | Admitting: Physician Assistant

## 2018-05-12 DIAGNOSIS — D2361 Other benign neoplasm of skin of right upper limb, including shoulder: Secondary | ICD-10-CM | POA: Diagnosis not present

## 2018-05-12 DIAGNOSIS — L989 Disorder of the skin and subcutaneous tissue, unspecified: Secondary | ICD-10-CM | POA: Diagnosis not present

## 2018-05-17 ENCOUNTER — Encounter: Payer: Self-pay | Admitting: Cardiology

## 2018-05-17 ENCOUNTER — Ambulatory Visit: Payer: Medicare Other | Admitting: Cardiology

## 2018-05-17 VITALS — BP 126/64 | HR 47 | Ht 68.0 in | Wt 203.0 lb

## 2018-05-17 DIAGNOSIS — E785 Hyperlipidemia, unspecified: Secondary | ICD-10-CM

## 2018-05-17 DIAGNOSIS — I471 Supraventricular tachycardia: Secondary | ICD-10-CM

## 2018-05-17 NOTE — Patient Instructions (Signed)
Medication Instructions:  Your physician recommends that you continue on your current medications as directed. Please refer to the Current Medication list given to you today.  If you need a refill on your cardiac medications before your next appointment, please call your pharmacy.   Labwork: None ordered  Testing/Procedures: None ordered  Follow-Up: Your physician wants you to follow-up in: 6 months  with Dr. Camnitz.  You will receive a reminder letter in the mail two months in advance. If you don't receive a letter, please call our office to schedule the follow-up appointment.  Thank you for choosing [facility]!!   Deah Ottaway, RN [phone]  Any Other Special Instructions Will Be Listed Below (If Applicable).        

## 2018-05-17 NOTE — Progress Notes (Signed)
Electrophysiology Office Note   Date:  05/17/2018   ID:  Sheri Lopez, DOB 29-Nov-1955, MRN [id number]  PCP:  [name], MD  Cardiologist:  [name] Primary Electrophysiologist:  Brent Taillon [name], MD    No chief complaint on file.    History of Present Illness: Sheri Lopez is a 62 y.o. female who is being seen today for the evaluation of SVT at the request of Schoenhoff, Altamese Cabal, *. Presenting today for electrophysiology evaluation. She has a history of hypertension, bradycardia, legal blindness, and breast cancer. She was no I think she has a lazy eye Ted. Bradycardic by her PCP with heart rates of 44 on EKG. She has had an echo that showed normal systolic function and a Myoview with normal function and no ischemia. She had palpitations and wore a 14 day monitor that showed PACs, PVCs and atrial tachycardia. Her average heart rate was 62.  She was put on both metoprolol and flecainide at her last visit.  Today, denies symptoms of palpitations, chest pain, shortness of breath, orthopnea, PND, lower extremity edema, claudication, dizziness, presyncope, syncope, bleeding, or neurologic sequela. The patient is tolerating medications without difficulties.  Overall she is doing well.  She has no major complaints.  Past Medical History:  Diagnosis Date  . Allergic rhinitis   . Anxiety   . Atrial tachycardia ([place]) 01/27/2017  . Bradycardia   . Cancer [facility])    breast  . Essential hypertension 12/08/2016  . Fatigue 12/08/2016  . Frozen shoulder   . Heart murmur   . Hyperlipidemia 12/08/2016  . Hypertension   . Lower extremity edema 12/08/2016  . Palpitations 12/08/2016  . Psoriasis   . Retinitis pigmentosa   . Shortness of breath 12/08/2016  . Thyroid disease   . Venous insufficiency    Past Surgical History:  Procedure Laterality Date  . BREAST EXCISIONAL BIOPSY Right   . BREAST EXCISIONAL BIOPSY Left 2013     Current Outpatient Medications  Medication Sig  Dispense Refill  . albuterol (PROVENTIL HFA;VENTOLIN HFA) 108 (90 Base) MCG/ACT inhaler Inhale 2 puffs into the lungs every 4 (four) hours as needed for shortness of breath. 1 Inhaler 2  . Azelastine-Fluticasone 137-50 MCG/ACT SUSP Place 1 puff into the nose 2 (two) times daily.    . clobetasol cream (TEMOVATE) 8.67 % Apply 1 application topically as directed.    . flecainide (TAMBOCOR) 100 MG tablet TAKE 1 TABLET(100 MG) BY MOUTH TWICE DAILY 180 tablet 2  . hydrochlorothiazide (HYDRODIURIL) 12.5 MG tablet TAKE ONE-HALF TABLET BY MOUTH DAILY 45 tablet 5  . ibuprofen (ADVIL,MOTRIN) 200 MG tablet Take 600 mg by mouth every 6 (six) hours as needed (tooth pain).    Marland Kitchen levocetirizine (XYZAL) 5 MG tablet Take 5 mg by mouth every evening.    Marland Kitchen levothyroxine (SYNTHROID, LEVOTHROID) 75 MCG tablet Take 75 mcg by mouth daily before breakfast.    . losartan-hydrochlorothiazide (HYZAAR) 100-12.5 MG tablet Take 1 tablet by mouth daily.    . meloxicam (MOBIC) 7.5 MG tablet Take 1 tablet (7.5 mg total) by mouth daily. 15 tablet 0  . metoprolol succinate (TOPROL-XL) 25 MG 24 hr tablet TAKE 1/2 TABLET(12.5 MG) BY MOUTH DAILY 45 tablet 2  . NON FORMULARY Take by mouth daily. Plexus supplement    . ranitidine (ZANTAC) 150 MG capsule Take 150 mg by mouth 2 (two) times daily.    . sertraline (ZOLOFT) 50 MG tablet Take 50 mg by mouth daily.    Marland Kitchen  UNKNOWN TO PATIENT      No current facility-administered medications for this visit.     Allergies:   Keflex [cephalexin]   Social History:  The patient  reports that she has never smoked. She has never used smokeless tobacco.   Family History:  [name] history includes Arrhythmia in her mother; Breast cancer in her maternal aunt; CAD in her father; Cancer in her father and mother; Heart attack in her maternal grandfather and paternal grandfather; Heart failure in her paternal grandmother; Hyperlipidemia in her mother; Hypertension in her brother, mother, and  sister.    ROS:  Please see the history of present illness.   Otherwise, review of systems is positive for hearing loss, snoring, wheezing.   All other systems are reviewed and negative.   PHYSICAL EXAM: VS:  BP 126/64   Pulse (!) 47   Ht 5\' 8"  (1.727 m)   Wt 203 lb (92.1 kg)   BMI 30.87 kg/m  , BMI Body mass index is 30.87 kg/m. GEN: Well nourished, well developed, in no acute distress  HEENT: normal  Neck: no JVD, carotid bruits, or masses Cardiac: RRR; no murmurs, rubs, or gallops,no edema  Respiratory:  clear to auscultation bilaterally, normal work of breathing GI: soft, nontender, nondistended, + BS MS: no deformity or atrophy  Skin: warm and dry Neuro:  Strength and sensation are intact Psych: euthymic mood, full affect  EKG:  EKG is ordered today. Personal review of the ekg ordered shows sinus bradycardia  Recent Labs: No results found for requested labs within last 8760 hours.    Lipid Panel  No results found for: CHOL, TRIG, HDL, CHOLHDL, VLDL, LDLCALC, LDLDIRECT   Wt Readings from Last 3 Encounters:  05/17/18 203 lb (92.1 kg)  11/04/17 193 lb (87.5 kg)  08/12/17 195 lb (88.5 kg)      Other studies Reviewed: Additional studies/ records that were reviewed today include: Myoview 12/10/16  Review of the above records today demonstrates:   Normal perfusion No ischemia  Nuclear stress EF: 59%.  Low risk study  TTE 12/21/16 - Left ventricle: The cavity size was normal. Systolic function was   normal. The estimated ejection fraction was in the range of 55%   to 60%. Wall motion was normal; there were no regional wall   motion abnormalities. Left ventricular diastolic function   parameters were normal.  Cardiac monitor 01/26/17 - personally reviewed Predominant rhythm: sinus rhythm, sinus bradycardia Average heart rate: 62 bpm  Sinus rhythm and sinus bradycardia  PACs, PVCs Atrial tachycardia  ETT 02/17/17  Blood pressure demonstrated a normal  response to exercise.  Nonspecific ST changes noted during exercise. Immediate recovery there was 12mm of horizontal ST segment depression in the inferolateral leads  Abnormal exercise stress test in recovery. The patient had no symptoms during the stress test.  Submaximal ETT with patient achieving only 74% maximum predicted heart rate. The patient achieved 9.1 mets.  Recommed pharmacolgic myocardial perfusion scan.  ASSESSMENT AND PLAN:  1.  Atrial tachycardia: Currently on metoprolol and flecainide.  She has had a great reduction in her atrial tachycardia burden.  No changes at this time.    2.  Fatigue: It is unclear if her fatigue is due to medication switches.  She is currently on Toprol-XL with a low dose.  No changes.  3. Hyperlipidemia: Controlled with diet and exercise  Current medicines are reviewed at length with the patient today.   The patient does not have concerns  regarding her medicines.  The following changes were made today: None  Labs/ tests ordered today include:  Orders Placed This Encounter  Procedures  . EKG 12-Lead     Disposition:   FU with [place] Weekly 6 months  Signed, Halim Surrette [name], MD  05/17/2018 4:35 PM     [address] [place] [address] [phone] (office) [phone] (fax)

## 2018-05-19 ENCOUNTER — Telehealth: Payer: Self-pay | Admitting: *Deleted

## 2018-05-19 NOTE — Telephone Encounter (Signed)
Pt informed and verbalized understanding

## 2018-05-19 NOTE — Telephone Encounter (Signed)
-----   Message from Will [name], MD sent at 05/19/2018  8:40 AM EST ----- Can you let mrs Care know that flecainide is unlikely to cause hearing loss. Thanks! ----- Message ----- From: Erskine Emery, [facility] Sent: 05/19/2018   7:43 AM EST To: Constance Haw, MD  I have not seen any issues with flecainide and one-sided hearing loss. It can be associated rarely with tinnitus but that would generally be bilateral. So I do not think this is medication related.   Thanks! ----- Message ----- From: Constance Haw, MD Sent: 05/17/2018   4:35 PM EST To: Erskine Emery, [facility]  Georgina Peer,  This patient asked me if there as an issue with flecainide and one sided hearing loss. I told her I didn't know of anything but I'd ask. Didn't see much on up to date. Thanks.  Will

## 2018-07-25 ENCOUNTER — Encounter: Payer: Self-pay | Admitting: Cardiovascular Disease

## 2018-07-25 ENCOUNTER — Other Ambulatory Visit: Payer: Self-pay | Admitting: Nurse Practitioner

## 2018-07-25 ENCOUNTER — Ambulatory Visit
Admission: RE | Admit: 2018-07-25 | Discharge: 2018-07-25 | Disposition: A | Discharge status: Home / Self Care | Payer: Medicare Other | Source: Ambulatory Visit | Attending: Nurse Practitioner | Admitting: Nurse Practitioner

## 2018-07-25 ENCOUNTER — Ambulatory Visit: Payer: Medicare Other | Admitting: Cardiovascular Disease

## 2018-07-25 DIAGNOSIS — R1011 Right upper quadrant pain: Secondary | ICD-10-CM | POA: Diagnosis not present

## 2018-07-25 DIAGNOSIS — I471 Supraventricular tachycardia: Secondary | ICD-10-CM | POA: Diagnosis not present

## 2018-07-25 DIAGNOSIS — K219 Gastro-esophageal reflux disease without esophagitis: Secondary | ICD-10-CM | POA: Diagnosis not present

## 2018-07-25 DIAGNOSIS — E785 Hyperlipidemia, unspecified: Secondary | ICD-10-CM | POA: Diagnosis not present

## 2018-07-25 DIAGNOSIS — R1013 Epigastric pain: Secondary | ICD-10-CM

## 2018-07-25 DIAGNOSIS — E7800 Pure hypercholesterolemia, unspecified: Secondary | ICD-10-CM

## 2018-07-25 DIAGNOSIS — I4719 Other supraventricular tachycardia: Secondary | ICD-10-CM

## 2018-07-25 DIAGNOSIS — R0789 Other chest pain: Secondary | ICD-10-CM | POA: Diagnosis not present

## 2018-07-25 DIAGNOSIS — I10 Essential (primary) hypertension: Secondary | ICD-10-CM

## 2018-07-25 DIAGNOSIS — R0981 Nasal congestion: Secondary | ICD-10-CM | POA: Diagnosis not present

## 2018-07-25 DIAGNOSIS — R079 Chest pain, unspecified: Secondary | ICD-10-CM | POA: Diagnosis not present

## 2018-07-25 NOTE — Progress Notes (Signed)
Cardiology Office Note   Date:  07/25/2018   ID:  Sheri Lopez, DOB 1955/06/18, MRN [id number]  PCP:  [name], MD  Cardiologist:   Skeet Latch, MD   No chief complaint on file.   History of Present Illness: Sheri Lopez is a 63 y.o. female with hypertension, bradycardia, legal blindness, and breast cancer here for follow-up. She was initially seen 11/2016 for the evaluation of murmur.  She had previously seen her PCP and was noted to be bradycardic. Her heart rate was 44 on the ECG. She was also noted to have systolic murmur. Upon her clinical appointment with me no murmur was appreciated.  She was referred for an echo that revealed LVEF 55-60% and no significant valvular abnormalities. She also reported atypical chest pain and was referred for [facility] 12/09/16 where she was found to have normal systolic function and no ischemia.  She reported palpitations and had a 14 day event monitor that showed PACs, PVCs, and atrial tachycardia. She was noted to have both sinus rhythm and sinus bradycardia with an average heart rate of 62 bpm. There were no significant pauses.  Sheri Lopez reported persistent shortness of breath.  She was referred for PFTs that showed minimal airway obstruction.  She was given a trial of albuterol.  Her BP was running low so HCTZ was reduced.  Her BP has been better since making that change.  She continued to have palpitations and was too bradycardic for nodal agents.  She was referred to Dr. Curt Bears and was to be a candidate for both ablation and antiarrhythmics.  She was started on metoprolol 12.5mg  and flecainide.  She has noted improvement on this regimen.  Sheri Lopez reports abdominal discomfort over the last few days.  She has discomfort in her L upper abdomen that radiates into her back.  She notes a lot of flatulence.  It seems to get better after belching or passing gas.  Her bowel movements have been normal.  She has early satiety and  did not eat or drink much on Saturday or Sunday.  However, she has noted that she feels better after eating.  She is also had intermittent fevers.  She initially thought maybe she was having the flu.  She has not experienced any chest pain, shortness of breath, lower extremity edema, orthopnea, or PND.  She also denies lightheadedness or dizziness.  Her palpitations have been much better controlled on flecainide and metoprolol.  She started taking it only at night because when she took it twice a day it bothered her ears.  She saw her PCP today who did an EKG and noted that she had anterior and inferior Q waves that were not previously present.  Labs revealed a normal troponin.  Her white blood cell count was normal, as were her LFTs and bilirubin.  She recommended that she be evaluated by cardiology.  She had an abdominal CT with contrast that was unremarkable.   Past Medical History:  Diagnosis Date  . Allergic rhinitis   . Anxiety   . Atrial tachycardia ([place]) 01/27/2017  . Bradycardia   . Cancer [facility])    breast  . Essential hypertension 12/08/2016  . Fatigue 12/08/2016  . Frozen shoulder   . Heart murmur   . Hyperlipidemia 12/08/2016  . Hypertension   . Lower extremity edema 12/08/2016  . Palpitations 12/08/2016  . Psoriasis   . Retinitis pigmentosa   . Shortness of breath 12/08/2016  . Thyroid  disease   . Venous insufficiency     Past Surgical History:  Procedure Laterality Date  . BREAST EXCISIONAL BIOPSY Right   . BREAST EXCISIONAL BIOPSY Left 2013     Current Outpatient Medications  Medication Sig Dispense Refill  . albuterol (PROVENTIL HFA;VENTOLIN HFA) 108 (90 Base) MCG/ACT inhaler Inhale 2 puffs into the lungs every 4 (four) hours as needed for shortness of breath. 1 Inhaler 2  . Azelastine-Fluticasone 137-50 MCG/ACT SUSP Place 1 puff into the nose 2 (two) times daily.    . clobetasol cream (TEMOVATE) 9.24 % Apply 1 application topically as directed.    . flecainide  (TAMBOCOR) 100 MG tablet TAKE 1 TABLET(100 MG) BY MOUTH TWICE DAILY 180 tablet 2  . hydrochlorothiazide (HYDRODIURIL) 12.5 MG tablet TAKE ONE-HALF TABLET BY MOUTH DAILY 45 tablet 5  . ibuprofen (ADVIL,MOTRIN) 200 MG tablet Take 600 mg by mouth every 6 (six) hours as needed (tooth pain).    Marland Kitchen levocetirizine (XYZAL) 5 MG tablet Take 5 mg by mouth every evening.    Marland Kitchen levothyroxine (SYNTHROID, LEVOTHROID) 75 MCG tablet Take 75 mcg by mouth daily before breakfast.    . losartan-hydrochlorothiazide (HYZAAR) 100-12.5 MG tablet Take 1 tablet by mouth daily.    . meloxicam (MOBIC) 7.5 MG tablet Take 1 tablet (7.5 mg total) by mouth daily. 15 tablet 0  . metoprolol succinate (TOPROL-XL) 25 MG 24 hr tablet TAKE 1/2 TABLET(12.5 MG) BY MOUTH DAILY 45 tablet 2  . NON FORMULARY Take by mouth daily. Plexus supplement    . ranitidine (ZANTAC) 150 MG capsule Take 150 mg by mouth 2 (two) times daily.    . sertraline (ZOLOFT) 50 MG tablet Take 50 mg by mouth daily.    Marland Kitchen UNKNOWN TO PATIENT      No current facility-administered medications for this visit.     Allergies:   Keflex [cephalexin]    Social History:  The patient  reports that she has never smoked. She has never used smokeless tobacco.   Family History:  [name] history includes Arrhythmia in her mother; Breast cancer in her maternal aunt; CAD in her father; Cancer in her father and mother; Heart attack in her maternal grandfather and paternal grandfather; Heart failure in her paternal grandmother; Hyperlipidemia in her mother; Hypertension in her brother, mother, and sister.    ROS:  Please see the history of present illness.   Otherwise, review of systems are positive for none.   All other systems are reviewed and negative.    PHYSICAL EXAM: VS:  BP 126/78   Pulse (!) 51   Ht 5\' 8"  (1.727 m)   Wt 200 lb 6.4 oz (90.9 kg)   BMI 30.47 kg/m  , BMI Body mass index is 30.47 kg/m. GENERAL:  Well appearing HEENT: Pupils equal round and  reactive, fundi not visualized, oral mucosa unremarkable NECK:  No jugular venous distention, waveform within normal limits, carotid upstroke brisk and symmetric, no bruits, no thyromegaly LYMPHATICS:  No cervical adenopathy LUNGS:  Clear to auscultation bilaterally HEART:  RRR.  PMI not displaced or sustained,S1 and S2 within normal limits, no S3, no S4, no clicks, no rubs, II/VI systolic murmur ABD:  Flat, hyperactive bowel sounds normal in frequency in pitch, no bruits, no rebound, no guarding, no midline pulsatile mass, no hepatomegaly, no splenomegaly.  Mild epigastric tenderness to palpation. EXT:  2 plus pulses throughout, no edema, no cyanosis no clubbing SKIN:  No rashes no nodules NEURO:  Cranial nerves II  through XII grossly intact, motor grossly intact throughout [facility]:  Cognitively intact, oriented to person place and time  EKG:  EKG is ordered today. The ekg ordered today demonstrates Sinus bradycardia. Rate 57 bpm. PVCs. Low-voltage 07/18/11: 11/10/16: Sinus bradycardia. Rate 48 bpm. 04/05/17: Sinus bradycardia.  Rate 53 bpm.  Low voltage precordial and limb leads.   07/25/18: Sinus bradycardia.  Rate 51 bpm.  Low voltage.  Prior inferior infarct.  Prior septal infarct.  Lexiscan Myoview 12/10/16::  Normal perfusion No ischemia  Nuclear stress EF: 59%.  Low risk study   Echo 12/21/16:  Study Conclusions  - Left ventricle: The cavity size was normal. Systolic function was   normal. The estimated ejection fraction was in the range of 55%   to 60%. Wall motion was normal; there were no regional wall   motion abnormalities. Left ventricular diastolic function   parameters were normal.  14 Day Event Monitor 01/04/17:  Quality: Fair.  Baseline artifact. Predominant rhythm: sinus rhythm, sinus bradycardia Average heart rate: 62 bpm  Sinus rhythm and sinus bradycardia  PACs, PVCs Atrial tachycardia  Recent Labs: No results found for requested labs within last 8760  hours.   08/24/16: Sodium 141, potassium 4.1, BUN 17, creatinine 0.94 AST 16, ALT 15 TSH 2.54 Total cholesterol 229, triglycerides 139, HDL 79, LDL 123  07/25/2018:  Sodium 138, potassium 3.4, BUN 14, creatinine 1.08 AST 14, ALT 11, total bilirubin 0.5, direct bili 0.1 Troponin less than 0.01 WBC 10.8, hemoglobin 13.6, hematocrit 40.1, platelets 339  Lipid Panel No results found for: CHOL, TRIG, HDL, CHOLHDL, VLDL, LDLCALC, LDLDIRECT    Wt Readings from Last 3 Encounters:  07/25/18 200 lb 6.4 oz (90.9 kg)  05/17/18 203 lb (92.1 kg)  11/04/17 193 lb (87.5 kg)      ASSESSMENT AND PLAN:  # Hypertension: BP well-controlled.  Continue metoprolol,  HCTZ and HCTZ/losartan.   and has been low at times.    # Atrial tachycardia: # PACs/PVCs: Controlled on metoprolol and flecainde.  # Hyperlipidemia: ASCVD 10 year risk is 4.3%. No aspirin or statin at this time. Continue to work on diet and exercise.  # Abdominal pain:  Symptoms are mostly epigastric and atypical for cardiac.  They improve with belching.  Her EKG does show new Inferior Q waves that were not previously present.  We will get a Lexiscan Myoview to evaluate for ischemia.  Labs are unremarkable including troponin.    Current medicines are reviewed at length with the patient today.  The patient does not have concerns regarding medicines.  The following changes have been made:  none  Labs/ tests ordered today include:   Orders Placed This Encounter  Procedures  . MYOCARDIAL PERFUSION IMAGING     Disposition:   FU with Christon Parada C. [name], MD, [facility] in 1 year.      Signed, Jadamarie Butson C. [name], MD, [facility]  07/25/2018 5:10 PM    [facility]

## 2018-07-25 NOTE — Patient Instructions (Signed)
Medication Instructions:  Your physician recommends that you continue on your current medications as directed. Please refer to the Current Medication list given to you today.  If you need a refill on your cardiac medications before your next appointment, please call your pharmacy.   Lab work: none  Testing/Procedures: Your physician has requested that you have a lexiscan myoview. For further information please visit [email or web address]. Please follow instruction sheet, as given.  Follow-Up: At [facility], you and your health needs are our priority.  As part of our continuing mission to provide you with exceptional heart care, we have created designated Provider Care Teams.  These Care Teams include your primary Cardiologist (physician) and Advanced Practice Providers (APPs -  Physician Assistants and Nurse Practitioners) who all work together to provide you with the care you need, when you need it. You will need a follow up appointment in 12 months.  Please call our office 2 months in advance to schedule this appointment.  You may see DR [facility] or one of the following Advanced Practice Providers on your designated Care Team:   Kerin Ransom, PA-C Roby Lofts, [place] . Sande Rives, PA-C  Any Other Special Instructions Will Be Listed Below (If Applicable).

## 2018-07-25 NOTE — Addendum Note (Signed)
Addended by: Earvin Hansen on: 07/25/2018 05:46 PM   Modules accepted: Orders

## 2018-07-27 ENCOUNTER — Telehealth (HOSPITAL_COMMUNITY): Payer: Self-pay

## 2018-07-27 NOTE — Telephone Encounter (Signed)
Encounter complete. 

## 2018-07-29 ENCOUNTER — Ambulatory Visit (HOSPITAL_COMMUNITY)
Admission: RE | Admit: 2018-07-29 | Discharge: 2018-07-29 | Disposition: A | Discharge status: Home / Self Care | Payer: Medicare Other | Source: Ambulatory Visit | Attending: Cardiology | Admitting: Cardiology

## 2018-07-29 DIAGNOSIS — R0789 Other chest pain: Secondary | ICD-10-CM

## 2018-07-29 LAB — MYOCARDIAL PERFUSION IMAGING
LV dias vol: 113 mL (ref 46–106)
LV sys vol: 43 mL
Peak HR: 70 {beats}/min
Rest HR: 46 {beats}/min
SDS: 0
SRS: 2
SSS: 2
TID: 1.14

## 2018-07-29 MED ORDER — REGADENOSON 0.4 MG/5ML IV SOLN
0.4000 mg | Freq: Once | INTRAVENOUS | Status: AC
  Administered 2018-07-29: 0.4 mg via INTRAVENOUS

## 2018-07-29 MED ORDER — TECHNETIUM TC 99M TETROFOSMIN IV KIT
10.9000 | PACK | Freq: Once | INTRAVENOUS | Status: AC | PRN
  Administered 2018-07-29: 10.9 via INTRAVENOUS
  Filled 2018-07-29: qty 11

## 2018-07-29 MED ORDER — TECHNETIUM TC 99M TETROFOSMIN IV KIT
32.0000 | PACK | Freq: Once | INTRAVENOUS | Status: AC | PRN
  Administered 2018-07-29: 32 via INTRAVENOUS
  Filled 2018-07-29: qty 32

## 2018-08-09 DIAGNOSIS — D229 Melanocytic nevi, unspecified: Secondary | ICD-10-CM | POA: Diagnosis not present

## 2018-10-04 ENCOUNTER — Other Ambulatory Visit: Payer: Self-pay | Admitting: Cardiovascular Disease

## 2018-10-04 ENCOUNTER — Other Ambulatory Visit: Payer: Self-pay | Admitting: Cardiology

## 2018-10-04 NOTE — Telephone Encounter (Signed)
HCTZ 12.5 mg refilled. 

## 2018-10-27 ENCOUNTER — Telehealth: Payer: Self-pay | Admitting: *Deleted

## 2018-10-27 NOTE — Telephone Encounter (Signed)

## 2018-11-03 DIAGNOSIS — E039 Hypothyroidism, unspecified: Secondary | ICD-10-CM | POA: Diagnosis not present

## 2018-11-03 DIAGNOSIS — Z0000 Encounter for general adult medical examination without abnormal findings: Secondary | ICD-10-CM | POA: Diagnosis not present

## 2018-11-03 DIAGNOSIS — I10 Essential (primary) hypertension: Secondary | ICD-10-CM | POA: Diagnosis not present

## 2018-11-04 ENCOUNTER — Other Ambulatory Visit: Payer: Self-pay

## 2018-11-04 ENCOUNTER — Telehealth (INDEPENDENT_AMBULATORY_CARE_PROVIDER_SITE_OTHER): Payer: Medicare Other | Admitting: Cardiology

## 2018-11-04 DIAGNOSIS — I471 Supraventricular tachycardia: Secondary | ICD-10-CM | POA: Diagnosis not present

## 2018-11-04 MED ORDER — DILTIAZEM HCL ER COATED BEADS 120 MG PO CP24: 120.0000 mg | ORAL_CAPSULE | Freq: Every day | ORAL | 6 refills | Status: DC

## 2018-11-04 NOTE — Progress Notes (Signed)
Electrophysiology TeleHealth Note   Due to national recommendations of social distancing due to COVID 19, an audio/video telehealth visit is felt to be most appropriate for this patient at this time.  See Epic message for the patient's consent to telehealth for [facility].   Date:  11/04/2018   ID:  Sheri Lopez, DOB 13-Feb-1956, MRN [id number]  Location: patient's home  Provider location: [address] [place]  Evaluation Performed: Follow-up visit  PCP:  [name], MD  Cardiologist:  [name] Electrophysiologist:  Dr Curt Bears  Chief Complaint:  palpitations  History of Present Illness:    Sheri Lopez is a 63 y.o. female who presents via audio/video conferencing for a telehealth visit today.  Since last being seen in our clinic, the patient reports doing very well.  Today, she denies symptoms of palpitations, chest pain, shortness of breath,  lower extremity edema, dizziness, presyncope, or syncope.  The patient is otherwise without complaint today.  The patient denies symptoms of fevers, chills, cough, or new SOB worrisome for COVID 19.  She has a history of hypertension, bradycardia, legal blindness, and breast cancer.  She has a history of PACs, PVCs, and atrial tachycardia found on 14-day monitoring.  She is currently on flecainide and metoprolol.  Today, denies symptoms of palpitations, chest pain, shortness of breath, orthopnea, PND, lower extremity edema, claudication, dizziness, presyncope, syncope, bleeding, or neurologic sequela. The patient is tolerating medications without difficulties.  She occasionally notices palpitations but they are quite rare.  She does note that during the middle of the day she feels quite fatigued.  She is concerned that it could be due to some of her medications.  Past Medical History:  Diagnosis Date  . Allergic rhinitis   . Anxiety   . Atrial tachycardia ([place]) 01/27/2017  . Bradycardia   . Cancer [facility])    breast  . Essential hypertension 12/08/2016  . Fatigue 12/08/2016  . Frozen shoulder   . Heart murmur   . Hyperlipidemia 12/08/2016  . Hypertension   . Lower extremity edema 12/08/2016  . Palpitations 12/08/2016  . Psoriasis   . Retinitis pigmentosa   . Shortness of breath 12/08/2016  . Thyroid disease   . Venous insufficiency     Past Surgical History:  Procedure Laterality Date  . BREAST EXCISIONAL BIOPSY Right   . BREAST EXCISIONAL BIOPSY Left 2013    Current Outpatient Medications  Medication Sig Dispense Refill  . albuterol (PROVENTIL HFA;VENTOLIN HFA) 108 (90 Base) MCG/ACT inhaler Inhale 2 puffs into the lungs every 4 (four) hours as needed for shortness of breath. 1 Inhaler 2  . Azelastine-Fluticasone 137-50 MCG/ACT SUSP Place 1 puff into the nose 2 (two) times daily.    . clobetasol cream (TEMOVATE) 9.67 % Apply 1 application topically as directed.    . flecainide (TAMBOCOR) 100 MG tablet TAKE 1 TABLET(100 MG) BY MOUTH TWICE DAILY 180 tablet 2  . hydrochlorothiazide (HYDRODIURIL) 12.5 MG tablet TAKE ONE-HALF TABLET BY MOUTH DAILY 45 tablet 5  . ibuprofen (ADVIL,MOTRIN) 200 MG tablet Take 600 mg by mouth every 6 (six) hours as needed (tooth pain).    Marland Kitchen levocetirizine (XYZAL) 5 MG tablet Take 5 mg by mouth every evening.    Marland Kitchen levothyroxine (SYNTHROID, LEVOTHROID) 75 MCG tablet Take 75 mcg by mouth daily before breakfast.    . losartan-hydrochlorothiazide (HYZAAR) 100-12.5 MG tablet Take 1 tablet by mouth daily.    . meloxicam (MOBIC) 7.5 MG tablet Take 1  tablet (7.5 mg total) by mouth daily. 15 tablet 0  . metoprolol succinate (TOPROL-XL) 25 MG 24 hr tablet TAKE 1/2 TABLET(12.5 MG) BY MOUTH DAILY 45 tablet 2  . NON FORMULARY Take by mouth daily. Plexus supplement    . ranitidine (ZANTAC) 150 MG capsule Take 150 mg by mouth 2 (two) times daily.    . sertraline (ZOLOFT) 50 MG tablet Take 50 mg by mouth daily.    Marland Kitchen UNKNOWN TO PATIENT      No current facility-administered  medications for this visit.     Allergies:   Keflex [cephalexin]   Social History:  The patient  reports that she has never smoked. She has never used smokeless tobacco.   Family History:  The patient's  family history includes Arrhythmia in her mother; Breast cancer in her maternal aunt; CAD in her father; Cancer in her father and mother; Heart attack in her maternal grandfather and paternal grandfather; Heart failure in her paternal grandmother; Hyperlipidemia in her mother; Hypertension in her brother, mother, and sister.   ROS:  Please see the history of present illness.   All other systems are personally reviewed and negative.    Exam:    Vital Signs:  BP 125/75   Pulse (!) 47   Well appearing, alert and conversant, regular work of breathing,  good skin color Eyes- anicteric, neuro- grossly intact, skin- no apparent rash or lesions or cyanosis, mouth- oral mucosa is pink   Labs/Other Tests and Data Reviewed:    Recent Labs: No results found for requested labs within last 8760 hours.   Wt Readings from Last 3 Encounters:  07/29/18 200 lb (90.7 kg)  07/25/18 200 lb 6.4 oz (90.9 kg)  05/17/18 203 lb (92.1 kg)     Other studies personally reviewed: Additional studies/ records that were reviewed today include: ECG 07/25/18 personally reviewed  Review of the above records today demonstrates:  Sinus rhythm   ASSESSMENT & PLAN:    1.  Atrial tachycardia: Currently on flecainide and metoprolol.  She is minimally symptomatic from her atrial tachycardia.  She is having some fatigue.  Due to that, we Endy Easterly plan to switch her from metoprolol to diltiazem 120 mg.  2.  Hypertension: Well-controlled   COVID 19 screen The patient denies symptoms of COVID 19 at this time.  The importance of social distancing was discussed today.  Follow-up: 6 months Current medicines are reviewed at length with the patient today.   The patient does not have concerns regarding her medicines.  The  following changes were made today: Stop Toprol-XL, start diltiazem  Labs/ tests ordered today include:  No orders of the defined types were placed in this encounter.    Patient Risk:  after full review of this patients clinical status, I feel that they are at moderate risk at this time.  Today, I have spent 10 minutes with the patient with telehealth technology discussing atrial tachycardia, coronavirus.    Signed, Sheri Mirarchi [name], MD  11/04/2018 10:28 AM     [address] [place]  [address] [phone] (office) [phone] (fax)

## 2018-11-04 NOTE — Addendum Note (Signed)
Addended by: Stanton Kidney on: 11/04/2018 10:50 AM   Modules accepted: Orders

## 2018-11-09 DIAGNOSIS — R899 Unspecified abnormal finding in specimens from other organs, systems and tissues: Secondary | ICD-10-CM | POA: Diagnosis not present

## 2018-11-09 DIAGNOSIS — N183 Chronic kidney disease, stage 3 (moderate): Secondary | ICD-10-CM | POA: Diagnosis not present

## 2018-11-14 ENCOUNTER — Other Ambulatory Visit: Payer: Self-pay | Admitting: Nurse Practitioner

## 2018-11-14 DIAGNOSIS — N183 Chronic kidney disease, stage 3 unspecified: Secondary | ICD-10-CM

## 2018-11-23 ENCOUNTER — Ambulatory Visit
Admission: RE | Admit: 2018-11-23 | Discharge: 2018-11-23 | Disposition: A | Discharge status: Home / Self Care | Payer: Medicare Other | Source: Ambulatory Visit | Attending: Nurse Practitioner | Admitting: Nurse Practitioner

## 2018-11-23 DIAGNOSIS — I10 Essential (primary) hypertension: Secondary | ICD-10-CM | POA: Diagnosis not present

## 2018-11-23 DIAGNOSIS — N183 Chronic kidney disease, stage 3 unspecified: Secondary | ICD-10-CM

## 2018-11-29 DIAGNOSIS — H903 Sensorineural hearing loss, bilateral: Secondary | ICD-10-CM | POA: Diagnosis not present

## 2018-11-29 DIAGNOSIS — H9113 Presbycusis, bilateral: Secondary | ICD-10-CM | POA: Diagnosis not present

## 2018-11-29 DIAGNOSIS — H9121 Sudden idiopathic hearing loss, right ear: Secondary | ICD-10-CM | POA: Diagnosis not present

## 2018-11-29 DIAGNOSIS — M542 Cervicalgia: Secondary | ICD-10-CM | POA: Diagnosis not present

## 2018-12-07 ENCOUNTER — Other Ambulatory Visit: Payer: Self-pay | Admitting: Otolaryngology

## 2018-12-07 DIAGNOSIS — M542 Cervicalgia: Secondary | ICD-10-CM

## 2018-12-07 DIAGNOSIS — H9121 Sudden idiopathic hearing loss, right ear: Secondary | ICD-10-CM

## 2018-12-07 DIAGNOSIS — H9113 Presbycusis, bilateral: Secondary | ICD-10-CM

## 2018-12-29 ENCOUNTER — Other Ambulatory Visit: Payer: Self-pay

## 2018-12-29 ENCOUNTER — Ambulatory Visit
Admission: RE | Admit: 2018-12-29 | Discharge: 2018-12-29 | Disposition: A | Discharge status: Home / Self Care | Payer: Medicare Other | Source: Ambulatory Visit | Attending: Otolaryngology | Admitting: Otolaryngology

## 2018-12-29 DIAGNOSIS — H5711 Ocular pain, right eye: Secondary | ICD-10-CM | POA: Diagnosis not present

## 2018-12-29 DIAGNOSIS — H9113 Presbycusis, bilateral: Secondary | ICD-10-CM

## 2018-12-29 DIAGNOSIS — M542 Cervicalgia: Secondary | ICD-10-CM

## 2018-12-29 DIAGNOSIS — H9121 Sudden idiopathic hearing loss, right ear: Secondary | ICD-10-CM

## 2018-12-29 DIAGNOSIS — H9193 Unspecified hearing loss, bilateral: Secondary | ICD-10-CM | POA: Diagnosis not present

## 2018-12-29 MED ORDER — GADOBENATE DIMEGLUMINE 529 MG/ML IV SOLN
10.0000 mL | Freq: Once | INTRAVENOUS | Status: AC | PRN
  Administered 2018-12-29: 10 mL via INTRAVENOUS

## 2019-01-02 ENCOUNTER — Other Ambulatory Visit: Payer: Self-pay | Admitting: Cardiology

## 2019-01-30 ENCOUNTER — Other Ambulatory Visit: Payer: Self-pay | Admitting: Nurse Practitioner

## 2019-01-30 DIAGNOSIS — Z1231 Encounter for screening mammogram for malignant neoplasm of breast: Secondary | ICD-10-CM

## 2019-02-02 DIAGNOSIS — H9121 Sudden idiopathic hearing loss, right ear: Secondary | ICD-10-CM | POA: Diagnosis not present

## 2019-02-02 DIAGNOSIS — H903 Sensorineural hearing loss, bilateral: Secondary | ICD-10-CM | POA: Diagnosis not present

## 2019-02-02 DIAGNOSIS — H9113 Presbycusis, bilateral: Secondary | ICD-10-CM | POA: Diagnosis not present

## 2019-02-07 ENCOUNTER — Other Ambulatory Visit: Payer: Self-pay | Admitting: Physician Assistant

## 2019-02-07 DIAGNOSIS — D2271 Melanocytic nevi of right lower limb, including hip: Secondary | ICD-10-CM | POA: Diagnosis not present

## 2019-02-07 DIAGNOSIS — D2261 Melanocytic nevi of right upper limb, including shoulder: Secondary | ICD-10-CM | POA: Diagnosis not present

## 2019-02-07 DIAGNOSIS — D2361 Other benign neoplasm of skin of right upper limb, including shoulder: Secondary | ICD-10-CM | POA: Diagnosis not present

## 2019-02-07 DIAGNOSIS — D2371 Other benign neoplasm of skin of right lower limb, including hip: Secondary | ICD-10-CM | POA: Diagnosis not present

## 2019-03-06 DIAGNOSIS — H2181 Floppy iris syndrome: Secondary | ICD-10-CM | POA: Diagnosis not present

## 2019-03-06 DIAGNOSIS — H26412 Soemmering's ring, left eye: Secondary | ICD-10-CM | POA: Diagnosis not present

## 2019-03-06 DIAGNOSIS — H3552 Pigmentary retinal dystrophy: Secondary | ICD-10-CM | POA: Diagnosis not present

## 2019-03-10 DIAGNOSIS — Z23 Encounter for immunization: Secondary | ICD-10-CM | POA: Diagnosis not present

## 2019-03-15 ENCOUNTER — Other Ambulatory Visit: Payer: Self-pay

## 2019-03-15 ENCOUNTER — Ambulatory Visit
Admission: RE | Admit: 2019-03-15 | Discharge: 2019-03-15 | Disposition: A | Discharge status: Home / Self Care | Payer: Medicare Other | Source: Ambulatory Visit | Attending: Nurse Practitioner | Admitting: Nurse Practitioner

## 2019-03-15 DIAGNOSIS — Z1231 Encounter for screening mammogram for malignant neoplasm of breast: Secondary | ICD-10-CM | POA: Diagnosis not present

## 2019-03-17 DIAGNOSIS — M542 Cervicalgia: Secondary | ICD-10-CM | POA: Diagnosis not present

## 2019-03-17 DIAGNOSIS — R293 Abnormal posture: Secondary | ICD-10-CM | POA: Diagnosis not present

## 2019-03-17 DIAGNOSIS — M26609 Unspecified temporomandibular joint disorder, unspecified side: Secondary | ICD-10-CM | POA: Diagnosis not present

## 2019-03-17 DIAGNOSIS — R519 Headache, unspecified: Secondary | ICD-10-CM | POA: Diagnosis not present

## 2019-03-20 DIAGNOSIS — M542 Cervicalgia: Secondary | ICD-10-CM | POA: Diagnosis not present

## 2019-03-20 DIAGNOSIS — R293 Abnormal posture: Secondary | ICD-10-CM | POA: Diagnosis not present

## 2019-03-20 DIAGNOSIS — R519 Headache, unspecified: Secondary | ICD-10-CM | POA: Diagnosis not present

## 2019-03-20 DIAGNOSIS — M26609 Unspecified temporomandibular joint disorder, unspecified side: Secondary | ICD-10-CM | POA: Diagnosis not present

## 2019-03-23 DIAGNOSIS — R519 Headache, unspecified: Secondary | ICD-10-CM | POA: Diagnosis not present

## 2019-03-23 DIAGNOSIS — M542 Cervicalgia: Secondary | ICD-10-CM | POA: Diagnosis not present

## 2019-03-23 DIAGNOSIS — R293 Abnormal posture: Secondary | ICD-10-CM | POA: Diagnosis not present

## 2019-03-23 DIAGNOSIS — M26609 Unspecified temporomandibular joint disorder, unspecified side: Secondary | ICD-10-CM | POA: Diagnosis not present

## 2019-03-28 DIAGNOSIS — R293 Abnormal posture: Secondary | ICD-10-CM | POA: Diagnosis not present

## 2019-03-28 DIAGNOSIS — M26609 Unspecified temporomandibular joint disorder, unspecified side: Secondary | ICD-10-CM | POA: Diagnosis not present

## 2019-03-28 DIAGNOSIS — M542 Cervicalgia: Secondary | ICD-10-CM | POA: Diagnosis not present

## 2019-03-28 DIAGNOSIS — R519 Headache, unspecified: Secondary | ICD-10-CM | POA: Diagnosis not present

## 2019-03-31 DIAGNOSIS — M542 Cervicalgia: Secondary | ICD-10-CM | POA: Diagnosis not present

## 2019-03-31 DIAGNOSIS — M26609 Unspecified temporomandibular joint disorder, unspecified side: Secondary | ICD-10-CM | POA: Diagnosis not present

## 2019-03-31 DIAGNOSIS — R519 Headache, unspecified: Secondary | ICD-10-CM | POA: Diagnosis not present

## 2019-03-31 DIAGNOSIS — R293 Abnormal posture: Secondary | ICD-10-CM | POA: Diagnosis not present

## 2019-04-04 DIAGNOSIS — R519 Headache, unspecified: Secondary | ICD-10-CM | POA: Diagnosis not present

## 2019-04-04 DIAGNOSIS — M542 Cervicalgia: Secondary | ICD-10-CM | POA: Diagnosis not present

## 2019-04-04 DIAGNOSIS — R293 Abnormal posture: Secondary | ICD-10-CM | POA: Diagnosis not present

## 2019-04-04 DIAGNOSIS — M26609 Unspecified temporomandibular joint disorder, unspecified side: Secondary | ICD-10-CM | POA: Diagnosis not present

## 2019-04-07 DIAGNOSIS — R519 Headache, unspecified: Secondary | ICD-10-CM | POA: Diagnosis not present

## 2019-04-07 DIAGNOSIS — R293 Abnormal posture: Secondary | ICD-10-CM | POA: Diagnosis not present

## 2019-04-07 DIAGNOSIS — M26609 Unspecified temporomandibular joint disorder, unspecified side: Secondary | ICD-10-CM | POA: Diagnosis not present

## 2019-04-07 DIAGNOSIS — M542 Cervicalgia: Secondary | ICD-10-CM | POA: Diagnosis not present

## 2019-04-11 DIAGNOSIS — R293 Abnormal posture: Secondary | ICD-10-CM | POA: Diagnosis not present

## 2019-04-11 DIAGNOSIS — M542 Cervicalgia: Secondary | ICD-10-CM | POA: Diagnosis not present

## 2019-04-11 DIAGNOSIS — M26609 Unspecified temporomandibular joint disorder, unspecified side: Secondary | ICD-10-CM | POA: Diagnosis not present

## 2019-04-14 DIAGNOSIS — M542 Cervicalgia: Secondary | ICD-10-CM | POA: Diagnosis not present

## 2019-04-14 DIAGNOSIS — M26609 Unspecified temporomandibular joint disorder, unspecified side: Secondary | ICD-10-CM | POA: Diagnosis not present

## 2019-04-14 DIAGNOSIS — R293 Abnormal posture: Secondary | ICD-10-CM | POA: Diagnosis not present

## 2019-04-17 DIAGNOSIS — M542 Cervicalgia: Secondary | ICD-10-CM | POA: Diagnosis not present

## 2019-04-17 DIAGNOSIS — M26609 Unspecified temporomandibular joint disorder, unspecified side: Secondary | ICD-10-CM | POA: Diagnosis not present

## 2019-04-17 DIAGNOSIS — R293 Abnormal posture: Secondary | ICD-10-CM | POA: Diagnosis not present

## 2019-04-20 DIAGNOSIS — R519 Headache, unspecified: Secondary | ICD-10-CM | POA: Diagnosis not present

## 2019-04-20 DIAGNOSIS — M542 Cervicalgia: Secondary | ICD-10-CM | POA: Diagnosis not present

## 2019-04-20 DIAGNOSIS — M26609 Unspecified temporomandibular joint disorder, unspecified side: Secondary | ICD-10-CM | POA: Diagnosis not present

## 2019-04-20 DIAGNOSIS — R293 Abnormal posture: Secondary | ICD-10-CM | POA: Diagnosis not present

## 2019-04-28 DIAGNOSIS — M542 Cervicalgia: Secondary | ICD-10-CM | POA: Diagnosis not present

## 2019-04-28 DIAGNOSIS — M26609 Unspecified temporomandibular joint disorder, unspecified side: Secondary | ICD-10-CM | POA: Diagnosis not present

## 2019-04-28 DIAGNOSIS — R293 Abnormal posture: Secondary | ICD-10-CM | POA: Diagnosis not present

## 2019-04-28 DIAGNOSIS — R519 Headache, unspecified: Secondary | ICD-10-CM | POA: Diagnosis not present

## 2019-05-04 DIAGNOSIS — R293 Abnormal posture: Secondary | ICD-10-CM | POA: Diagnosis not present

## 2019-05-04 DIAGNOSIS — R519 Headache, unspecified: Secondary | ICD-10-CM | POA: Diagnosis not present

## 2019-05-04 DIAGNOSIS — M26609 Unspecified temporomandibular joint disorder, unspecified side: Secondary | ICD-10-CM | POA: Diagnosis not present

## 2019-05-04 DIAGNOSIS — M542 Cervicalgia: Secondary | ICD-10-CM | POA: Diagnosis not present

## 2019-05-08 DIAGNOSIS — M26609 Unspecified temporomandibular joint disorder, unspecified side: Secondary | ICD-10-CM | POA: Diagnosis not present

## 2019-05-08 DIAGNOSIS — R293 Abnormal posture: Secondary | ICD-10-CM | POA: Diagnosis not present

## 2019-05-08 DIAGNOSIS — R519 Headache, unspecified: Secondary | ICD-10-CM | POA: Diagnosis not present

## 2019-05-08 DIAGNOSIS — M542 Cervicalgia: Secondary | ICD-10-CM | POA: Diagnosis not present

## 2019-05-11 DIAGNOSIS — R293 Abnormal posture: Secondary | ICD-10-CM | POA: Diagnosis not present

## 2019-05-11 DIAGNOSIS — M26609 Unspecified temporomandibular joint disorder, unspecified side: Secondary | ICD-10-CM | POA: Diagnosis not present

## 2019-05-11 DIAGNOSIS — R519 Headache, unspecified: Secondary | ICD-10-CM | POA: Diagnosis not present

## 2019-05-11 DIAGNOSIS — M542 Cervicalgia: Secondary | ICD-10-CM | POA: Diagnosis not present

## 2019-05-16 ENCOUNTER — Encounter: Payer: Self-pay | Admitting: Cardiology

## 2019-05-16 ENCOUNTER — Other Ambulatory Visit: Payer: Self-pay

## 2019-05-16 ENCOUNTER — Ambulatory Visit: Payer: Medicare Other | Admitting: Cardiology

## 2019-05-16 VITALS — BP 116/70 | HR 58 | Ht 68.0 in | Wt 198.0 lb

## 2019-05-16 DIAGNOSIS — I471 Supraventricular tachycardia: Secondary | ICD-10-CM

## 2019-05-16 DIAGNOSIS — R293 Abnormal posture: Secondary | ICD-10-CM | POA: Diagnosis not present

## 2019-05-16 DIAGNOSIS — R519 Headache, unspecified: Secondary | ICD-10-CM | POA: Diagnosis not present

## 2019-05-16 DIAGNOSIS — M542 Cervicalgia: Secondary | ICD-10-CM | POA: Diagnosis not present

## 2019-05-16 DIAGNOSIS — M26609 Unspecified temporomandibular joint disorder, unspecified side: Secondary | ICD-10-CM | POA: Diagnosis not present

## 2019-05-16 MED ORDER — FLECAINIDE ACETATE 50 MG PO TABS: 50.0000 mg | ORAL_TABLET | Freq: Two times a day (BID) | ORAL | 1 refills | Status: DC

## 2019-05-16 MED ORDER — DILTIAZEM HCL ER COATED BEADS 120 MG PO CP24: 120.0000 mg | ORAL_CAPSULE | Freq: Every day | ORAL | 2 refills | Status: DC

## 2019-05-16 NOTE — Progress Notes (Signed)
Electrophysiology Office Note   Date:  05/16/2019   ID:  Sheri Lopez, DOB 1956/03/19, MRN [id number]  PCP:  Carolee Rota, NP  Cardiologist:  [name] Primary Electrophysiologist:  [name], MD    No chief complaint on file.    History of Present Illness: Sheri Lopez is a 63 y.o. female who is being seen today for the evaluation of SVT at the request of Carolee Rota, NP. Presenting today for electrophysiology evaluation. She has a history of hypertension, bradycardia, legal blindness, and breast cancer. She was no I think she has a lazy eye Ted. Bradycardic by her PCP with heart rates of 44 on EKG. She has had an echo that showed normal systolic function and a Myoview with normal function and no ischemia. She had palpitations and wore a 14 day monitor that showed PACs, PVCs and atrial tachycardia. Her average heart rate was 62.  She was put on both metoprolol and flecainide at her last visit.  Today, denies symptoms of palpitations, chest pain, shortness of breath, orthopnea, PND, lower extremity edema, claudication, dizziness, presyncope, syncope, bleeding, or neurologic sequela. The patient is tolerating medications without difficulties.  Overall she is doing well.  She is noted minimal palpitations.  She does have decreased hearing and tinnitus in her mainly right ear.  She is curious as to whether or not this could be due to medication side effects.  Past Medical History:  Diagnosis Date  . Allergic rhinitis   . Anxiety   . Atrial tachycardia ([place]) 01/27/2017  . Atypical nevus 03/27/2016   Left Mid Back-Moderate to Severe(w/s widershave)  . Atypical nevus 08/31/2017   Right Wrist-Atypical Proliferation ([place])  . Atypical nevus 04/12/2018   Right Upperarm-Atypical Proliferation ([place])  . Bradycardia   . Cancer [facility])    breast  . Essential hypertension 12/08/2016  . Fatigue 12/08/2016  . Frozen shoulder   . Heart murmur   . Hyperlipidemia  12/08/2016  . Hypertension   . Lower extremity edema 12/08/2016  . Palpitations 12/08/2016  . Psoriasis   . Retinitis pigmentosa   . Shortness of breath 12/08/2016  . Thyroid disease   . Venous insufficiency    Past Surgical History:  Procedure Laterality Date  . BREAST EXCISIONAL BIOPSY Right   . BREAST EXCISIONAL BIOPSY Left 2013     Current Outpatient Medications  Medication Sig Dispense Refill  . albuterol (PROVENTIL HFA;VENTOLIN HFA) 108 (90 Base) MCG/ACT inhaler Inhale 2 puffs into the lungs every 4 (four) hours as needed for shortness of breath. 1 Inhaler 2  . Azelastine-Fluticasone 137-50 MCG/ACT SUSP Place 1 puff into the nose 2 (two) times daily.    . clobetasol cream (TEMOVATE) [id number] % Apply 1 application topically as directed.    . diltiazem (CARDIZEM CD) 120 MG 24 hr capsule Take 1 capsule (120 mg total) by mouth daily. 90 capsule 2  . hydrochlorothiazide (HYDRODIURIL) 12.5 MG tablet TAKE ONE-HALF TABLET BY MOUTH DAILY 45 tablet 5  . ibuprofen (ADVIL,MOTRIN) 200 MG tablet Take 600 mg by mouth every 6 (six) hours as needed (tooth pain).    Marland Kitchen levothyroxine (SYNTHROID, LEVOTHROID) 75 MCG tablet Take 75 mcg by mouth daily before breakfast.    . losartan-hydrochlorothiazide (HYZAAR) 100-12.5 MG tablet Take 1 tablet by mouth daily.    . meloxicam (MOBIC) 7.5 MG tablet Take 1 tablet (7.5 mg total) by mouth daily. 15 tablet 0  . ranitidine (ZANTAC) 150 MG capsule Take 150 mg by mouth 2 (two) times  daily.    . sertraline (ZOLOFT) 50 MG tablet Take 50 mg by mouth daily.    Marland Kitchen UNKNOWN TO PATIENT     . flecainide (TAMBOCOR) 50 MG tablet Take 1 tablet (50 mg total) by mouth 2 (two) times daily. 180 tablet 1   No current facility-administered medications for this visit.    Allergies:   Keflex [cephalexin]   Social History:  The patient  reports that she has never smoked. She has never used smokeless tobacco.   Family History:  [name] history includes Arrhythmia in her  mother; Breast cancer in her maternal aunt; CAD in her father; Cancer in her father and mother; Heart attack in her maternal grandfather and paternal grandfather; Heart failure in her paternal grandmother; Hyperlipidemia in her mother; Hypertension in her brother, mother, and sister.    ROS:  Please see the history of present illness.   Otherwise, review of systems is positive for none.   All other systems are reviewed and negative.   PHYSICAL EXAM: VS:  BP 116/70   Pulse (!) 58   Ht 5\' 8"  (1.727 m)   Wt 198 lb (89.8 kg)   SpO2 98%   BMI 30.11 kg/m  , BMI Body mass index is 30.11 kg/m. GEN: Well nourished, well developed, in no acute distress  HEENT: normal  Neck: no JVD, carotid bruits, or masses Cardiac: RRR; no murmurs, rubs, or gallops,no edema  Respiratory:  clear to auscultation bilaterally, normal work of breathing GI: soft, nontender, nondistended, + BS MS: no deformity or atrophy  Skin: warm and dry Neuro:  Strength and sensation are intact Psych: euthymic mood, full affect  EKG:  EKG is ordered today. Personal review of the ekg ordered shows sinus rhythm, rate 58, poor R wave progression  Recent Labs: No results found for requested labs within last 8760 hours.    Lipid Panel  No results found for: CHOL, TRIG, HDL, CHOLHDL, VLDL, LDLCALC, LDLDIRECT   Wt Readings from Last 3 Encounters:  05/16/19 198 lb (89.8 kg)  07/29/18 200 lb (90.7 kg)  07/25/18 200 lb 6.4 oz (90.9 kg)      Other studies Reviewed: Additional studies/ records that were reviewed today include: Myoview 12/10/16  Review of the above records today demonstrates:   Normal perfusion No ischemia  Nuclear stress EF: 59%.  Low risk study  TTE 12/21/16 - Left ventricle: The cavity size was normal. Systolic function was   normal. The estimated ejection fraction was in the range of 55%   to 60%. Wall motion was normal; there were no regional wall   motion abnormalities. Left ventricular diastolic  function   parameters were normal.  Cardiac monitor 01/26/17 - personally reviewed Predominant rhythm: sinus rhythm, sinus bradycardia Average heart rate: 62 bpm  Sinus rhythm and sinus bradycardia  PACs, PVCs Atrial tachycardia  ETT 02/17/17  Blood pressure demonstrated a normal response to exercise.  Nonspecific ST changes noted during exercise. Immediate recovery there was 50mm of horizontal ST segment depression in the inferolateral leads  Abnormal exercise stress test in recovery. The patient had no symptoms during the stress test.  Submaximal ETT with patient achieving only 74% maximum predicted heart rate. The patient achieved 9.1 mets.  Recommed pharmacolgic myocardial perfusion scan.  ASSESSMENT AND PLAN:  1.  Atrial tachycardia: Currently on diltiazem and flecainide.  She feels much improved with less fatigue on diltiazem.  She has been having some tinnitus and thus we Sheri Lopez cut her flecainide in  half.  2.  Fatigue: Much improved on diltiazem off metoprolol  3. Hyperlipidemia: Controlled with diet and exercise  Current medicines are reviewed at length with the patient today.   The patient does not have concerns regarding her medicines.  The following changes were made today: Decrease flecainide  Labs/ tests ordered today include:  Orders Placed This Encounter  Procedures  . EKG 12-Lead     Disposition:   FU with Sheri Lopez 6 months  Signed, Afsana Liera [name], MD  05/16/2019 9:21 AM     [facility] [address] [place] [address] [phone] (office) [phone] (fax)

## 2019-05-18 DIAGNOSIS — R293 Abnormal posture: Secondary | ICD-10-CM | POA: Diagnosis not present

## 2019-05-18 DIAGNOSIS — M26609 Unspecified temporomandibular joint disorder, unspecified side: Secondary | ICD-10-CM | POA: Diagnosis not present

## 2019-05-18 DIAGNOSIS — M542 Cervicalgia: Secondary | ICD-10-CM | POA: Diagnosis not present

## 2019-05-18 DIAGNOSIS — R519 Headache, unspecified: Secondary | ICD-10-CM | POA: Diagnosis not present

## 2019-05-29 ENCOUNTER — Ambulatory Visit: Payer: Medicare Other | Attending: Internal Medicine

## 2019-05-29 DIAGNOSIS — Z20822 Contact with and (suspected) exposure to covid-19: Secondary | ICD-10-CM

## 2019-05-29 DIAGNOSIS — Z20828 Contact with and (suspected) exposure to other viral communicable diseases: Secondary | ICD-10-CM | POA: Diagnosis not present

## 2019-05-31 LAB — NOVEL CORONAVIRUS, NAA: SARS-CoV-2, NAA: NOT DETECTED

## 2019-06-05 ENCOUNTER — Other Ambulatory Visit: Payer: Self-pay | Admitting: Cardiology

## 2019-06-08 DIAGNOSIS — J019 Acute sinusitis, unspecified: Secondary | ICD-10-CM | POA: Diagnosis not present

## 2019-06-12 DIAGNOSIS — H903 Sensorineural hearing loss, bilateral: Secondary | ICD-10-CM | POA: Diagnosis not present

## 2019-06-20 DIAGNOSIS — R293 Abnormal posture: Secondary | ICD-10-CM | POA: Diagnosis not present

## 2019-06-20 DIAGNOSIS — M2560 Stiffness of unspecified joint, not elsewhere classified: Secondary | ICD-10-CM | POA: Diagnosis not present

## 2019-06-20 DIAGNOSIS — M542 Cervicalgia: Secondary | ICD-10-CM | POA: Diagnosis not present

## 2019-06-22 DIAGNOSIS — M542 Cervicalgia: Secondary | ICD-10-CM | POA: Diagnosis not present

## 2019-06-22 DIAGNOSIS — R293 Abnormal posture: Secondary | ICD-10-CM | POA: Diagnosis not present

## 2019-06-22 DIAGNOSIS — M2560 Stiffness of unspecified joint, not elsewhere classified: Secondary | ICD-10-CM | POA: Diagnosis not present

## 2019-06-26 DIAGNOSIS — R293 Abnormal posture: Secondary | ICD-10-CM | POA: Diagnosis not present

## 2019-06-26 DIAGNOSIS — M542 Cervicalgia: Secondary | ICD-10-CM | POA: Diagnosis not present

## 2019-06-26 DIAGNOSIS — M2560 Stiffness of unspecified joint, not elsewhere classified: Secondary | ICD-10-CM | POA: Diagnosis not present

## 2019-06-28 ENCOUNTER — Ambulatory Visit: Payer: Medicare Other

## 2019-06-29 DIAGNOSIS — M2560 Stiffness of unspecified joint, not elsewhere classified: Secondary | ICD-10-CM | POA: Diagnosis not present

## 2019-06-29 DIAGNOSIS — R293 Abnormal posture: Secondary | ICD-10-CM | POA: Diagnosis not present

## 2019-06-29 DIAGNOSIS — M542 Cervicalgia: Secondary | ICD-10-CM | POA: Diagnosis not present

## 2019-06-30 DIAGNOSIS — K219 Gastro-esophageal reflux disease without esophagitis: Secondary | ICD-10-CM | POA: Diagnosis not present

## 2019-06-30 DIAGNOSIS — J3089 Other allergic rhinitis: Secondary | ICD-10-CM | POA: Diagnosis not present

## 2019-06-30 DIAGNOSIS — H9121 Sudden idiopathic hearing loss, right ear: Secondary | ICD-10-CM | POA: Diagnosis not present

## 2019-06-30 DIAGNOSIS — H9113 Presbycusis, bilateral: Secondary | ICD-10-CM | POA: Diagnosis not present

## 2019-07-03 DIAGNOSIS — M542 Cervicalgia: Secondary | ICD-10-CM | POA: Diagnosis not present

## 2019-07-03 DIAGNOSIS — R293 Abnormal posture: Secondary | ICD-10-CM | POA: Diagnosis not present

## 2019-07-03 DIAGNOSIS — M2560 Stiffness of unspecified joint, not elsewhere classified: Secondary | ICD-10-CM | POA: Diagnosis not present

## 2019-07-06 DIAGNOSIS — M2560 Stiffness of unspecified joint, not elsewhere classified: Secondary | ICD-10-CM | POA: Diagnosis not present

## 2019-07-06 DIAGNOSIS — R293 Abnormal posture: Secondary | ICD-10-CM | POA: Diagnosis not present

## 2019-07-06 DIAGNOSIS — M542 Cervicalgia: Secondary | ICD-10-CM | POA: Diagnosis not present

## 2019-07-07 ENCOUNTER — Ambulatory Visit: Payer: Medicare Other | Attending: Internal Medicine

## 2019-07-07 DIAGNOSIS — Z23 Encounter for immunization: Secondary | ICD-10-CM

## 2019-07-07 NOTE — Progress Notes (Signed)
   Covid-19 Vaccination Clinic  Name:  Sheri Lopez    MRN: [id number] DOB: Oct 02, 1955  07/07/2019  Sheri Lopez was observed post Covid-19 immunization for 15 minutes without incidence. She was provided with Vaccine Information Sheet and instruction to access the V-Safe system.   Sheri Lopez was instructed to call 911 with any severe reactions post vaccine: Marland Kitchen Difficulty breathing  . Swelling of your face and throat  . A fast heartbeat  . A bad rash all over your body  . Dizziness and weakness    Immunizations Administered    Name Date Dose VIS Date Route   Pfizer COVID-19 Vaccine 07/07/2019  9:10 AM 0.3 mL 05/12/2019 Intramuscular   Manufacturer: [place]   Lot: [id number]   [place]: [id number]

## 2019-07-09 ENCOUNTER — Ambulatory Visit: Payer: Medicare Other

## 2019-07-10 ENCOUNTER — Other Ambulatory Visit: Payer: Self-pay | Admitting: Otolaryngology

## 2019-07-10 DIAGNOSIS — H9121 Sudden idiopathic hearing loss, right ear: Secondary | ICD-10-CM

## 2019-07-10 DIAGNOSIS — H1045 Other chronic allergic conjunctivitis: Secondary | ICD-10-CM | POA: Diagnosis not present

## 2019-07-10 DIAGNOSIS — H9113 Presbycusis, bilateral: Secondary | ICD-10-CM

## 2019-07-10 DIAGNOSIS — J3081 Allergic rhinitis due to animal (cat) (dog) hair and dander: Secondary | ICD-10-CM | POA: Diagnosis not present

## 2019-07-10 DIAGNOSIS — J301 Allergic rhinitis due to pollen: Secondary | ICD-10-CM | POA: Diagnosis not present

## 2019-07-10 DIAGNOSIS — J3089 Other allergic rhinitis: Secondary | ICD-10-CM | POA: Diagnosis not present

## 2019-07-11 DIAGNOSIS — M2560 Stiffness of unspecified joint, not elsewhere classified: Secondary | ICD-10-CM | POA: Diagnosis not present

## 2019-07-11 DIAGNOSIS — R293 Abnormal posture: Secondary | ICD-10-CM | POA: Diagnosis not present

## 2019-07-11 DIAGNOSIS — M542 Cervicalgia: Secondary | ICD-10-CM | POA: Diagnosis not present

## 2019-07-13 DIAGNOSIS — R293 Abnormal posture: Secondary | ICD-10-CM | POA: Diagnosis not present

## 2019-07-13 DIAGNOSIS — M2560 Stiffness of unspecified joint, not elsewhere classified: Secondary | ICD-10-CM | POA: Diagnosis not present

## 2019-07-13 DIAGNOSIS — M542 Cervicalgia: Secondary | ICD-10-CM | POA: Diagnosis not present

## 2019-07-14 ENCOUNTER — Other Ambulatory Visit: Payer: Self-pay | Admitting: Otolaryngology

## 2019-07-14 DIAGNOSIS — J329 Chronic sinusitis, unspecified: Secondary | ICD-10-CM

## 2019-07-18 ENCOUNTER — Ambulatory Visit
Admission: RE | Admit: 2019-07-18 | Discharge: 2019-07-18 | Disposition: A | Discharge status: Home / Self Care | Payer: Medicare Other | Source: Ambulatory Visit | Attending: Otolaryngology | Admitting: Otolaryngology

## 2019-07-18 DIAGNOSIS — M2560 Stiffness of unspecified joint, not elsewhere classified: Secondary | ICD-10-CM | POA: Diagnosis not present

## 2019-07-18 DIAGNOSIS — J329 Chronic sinusitis, unspecified: Secondary | ICD-10-CM | POA: Diagnosis not present

## 2019-07-18 DIAGNOSIS — R293 Abnormal posture: Secondary | ICD-10-CM | POA: Diagnosis not present

## 2019-07-18 DIAGNOSIS — M542 Cervicalgia: Secondary | ICD-10-CM | POA: Diagnosis not present

## 2019-07-20 NOTE — Telephone Encounter (Signed)
Followed up with pt. Pt advised to continue holding Diltiazem as symptoms were improved once stopping it. Aware I will forward to Dr. Curt Bears for an alternative rate controlling medication.  (Toprol stopped 10/2018 d/t fatigue). Reports no HR issues while off Diltiazem. Advised to call office is issues with HRs while awaiting advisement form MD.

## 2019-07-21 DIAGNOSIS — M2560 Stiffness of unspecified joint, not elsewhere classified: Secondary | ICD-10-CM | POA: Diagnosis not present

## 2019-07-21 DIAGNOSIS — M542 Cervicalgia: Secondary | ICD-10-CM | POA: Diagnosis not present

## 2019-07-21 DIAGNOSIS — R293 Abnormal posture: Secondary | ICD-10-CM | POA: Diagnosis not present

## 2019-07-24 DIAGNOSIS — M542 Cervicalgia: Secondary | ICD-10-CM | POA: Diagnosis not present

## 2019-07-24 DIAGNOSIS — M2560 Stiffness of unspecified joint, not elsewhere classified: Secondary | ICD-10-CM | POA: Diagnosis not present

## 2019-07-24 DIAGNOSIS — R293 Abnormal posture: Secondary | ICD-10-CM | POA: Diagnosis not present

## 2019-07-26 NOTE — Telephone Encounter (Signed)
Pt doing great since we spoke last week, No issues w/ HRs. Pt wondering if we truly need to start a daily medication or if we can wait and monitor.  Maybe a PRN medication for HRs? Will forward to [facility] for advisement.

## 2019-07-27 DIAGNOSIS — M542 Cervicalgia: Secondary | ICD-10-CM | POA: Diagnosis not present

## 2019-07-27 DIAGNOSIS — M2560 Stiffness of unspecified joint, not elsewhere classified: Secondary | ICD-10-CM | POA: Diagnosis not present

## 2019-07-27 DIAGNOSIS — R293 Abnormal posture: Secondary | ICD-10-CM | POA: Diagnosis not present

## 2019-07-28 MED ORDER — PROPRANOLOL HCL 10 MG PO TABS: 10.0000 mg | ORAL_TABLET | Freq: Three times a day (TID) | ORAL | 0 refills | Status: DC | PRN

## 2019-07-28 NOTE — Telephone Encounter (Signed)
Informed pt Dr Curt Bears agreeable to holding off on a daily HR medication at this time, can use a PRN med if prefers. Advised to take Propranolol 10 mg TID PRN if experiences palpitations/elevated HRs (>100 bpm). Advised to call office if she begins taking this medication frequently and/or HRs become a problem on a frequent basis. Patient verbalized understanding and agreeable to plan.

## 2019-07-31 DIAGNOSIS — M2560 Stiffness of unspecified joint, not elsewhere classified: Secondary | ICD-10-CM | POA: Diagnosis not present

## 2019-07-31 DIAGNOSIS — R293 Abnormal posture: Secondary | ICD-10-CM | POA: Diagnosis not present

## 2019-07-31 DIAGNOSIS — M542 Cervicalgia: Secondary | ICD-10-CM | POA: Diagnosis not present

## 2019-08-01 ENCOUNTER — Ambulatory Visit: Payer: Medicare Other | Attending: Internal Medicine

## 2019-08-01 DIAGNOSIS — Z23 Encounter for immunization: Secondary | ICD-10-CM | POA: Insufficient documentation

## 2019-08-01 NOTE — Progress Notes (Signed)
   Covid-19 Vaccination Clinic  Name:  Sheri Lopez    MRN: [id number] DOB: 1955/06/24  08/01/2019  Sheri Lopez was observed post Covid-19 immunization for 15 minutes without incident. She was provided with Vaccine Information Sheet and instruction to access the V-Safe system.   Sheri Lopez was instructed to call 911 with any severe reactions post vaccine: Marland Kitchen Difficulty breathing  . Swelling of face and throat  . A fast heartbeat  . A bad rash all over body  . Dizziness and weakness   Immunizations Administered    Name Date Dose VIS Date Route   Pfizer COVID-19 Vaccine 08/01/2019 10:02 AM 0.3 mL 05/12/2019 Intramuscular   Manufacturer: [place]   Lot: [id number]   [place]: [id number]

## 2019-08-03 DIAGNOSIS — M2560 Stiffness of unspecified joint, not elsewhere classified: Secondary | ICD-10-CM | POA: Diagnosis not present

## 2019-08-03 DIAGNOSIS — M542 Cervicalgia: Secondary | ICD-10-CM | POA: Diagnosis not present

## 2019-08-03 DIAGNOSIS — R293 Abnormal posture: Secondary | ICD-10-CM | POA: Diagnosis not present

## 2019-08-07 DIAGNOSIS — R293 Abnormal posture: Secondary | ICD-10-CM | POA: Diagnosis not present

## 2019-08-07 DIAGNOSIS — M542 Cervicalgia: Secondary | ICD-10-CM | POA: Diagnosis not present

## 2019-08-07 DIAGNOSIS — M2560 Stiffness of unspecified joint, not elsewhere classified: Secondary | ICD-10-CM | POA: Diagnosis not present

## 2019-08-10 DIAGNOSIS — M2560 Stiffness of unspecified joint, not elsewhere classified: Secondary | ICD-10-CM | POA: Diagnosis not present

## 2019-08-10 DIAGNOSIS — R293 Abnormal posture: Secondary | ICD-10-CM | POA: Diagnosis not present

## 2019-08-10 DIAGNOSIS — M542 Cervicalgia: Secondary | ICD-10-CM | POA: Diagnosis not present

## 2019-08-14 DIAGNOSIS — M2560 Stiffness of unspecified joint, not elsewhere classified: Secondary | ICD-10-CM | POA: Diagnosis not present

## 2019-08-14 DIAGNOSIS — R293 Abnormal posture: Secondary | ICD-10-CM | POA: Diagnosis not present

## 2019-08-14 DIAGNOSIS — M542 Cervicalgia: Secondary | ICD-10-CM | POA: Diagnosis not present

## 2019-09-07 NOTE — Progress Notes (Signed)
Cardiology Office Note   Date:  09/08/2019   ID:  Sheri Lopez, DOB 1955/08/06, MRN [id number]  PCP:  Carolee Rota, NP  Cardiologist:   Skeet Latch, MD   No chief complaint on file.   History of Present Illness: Sheri Lopez is a 64 y.o. female with hypertension, bradycardia, legal blindness, and breast cancer here for follow-up. She was initially seen 11/2016 for the evaluation of murmur.  She had previously seen her PCP and was noted to be bradycardic. Her heart rate was 44 on the ECG. She was also noted to have systolic murmur. Upon her clinical appointment with me no murmur was appreciated.  She was referred for an echo that revealed LVEF 55-60% and no significant valvular abnormalities. She also reported atypical chest pain and was referred for [facility] 12/09/16 where she was found to have normal systolic function and no ischemia.  She reported palpitations and had a 14 day event monitor that showed PACs, PVCs, and atrial tachycardia. She was noted to have both sinus rhythm and sinus bradycardia with an average heart rate of 62 bpm. There were no significant pauses.  Sheri Lopez reported persistent shortness of breath.  She was referred for PFTs that showed minimal airway obstruction.  She was given a trial of albuterol.  Her BP was running low so HCTZ was reduced.  Her BP has been better since making that change.  She continued to have palpitations and was too bradycardic for nodal agents.  She was referred to Dr. Curt Bears and was to be a candidate for both ablation and antiarrhythmics.  She was started on metoprolol 12.5mg  and flecainide.  Metoprolol was switched to diltiazem due to fatigue.  Diltiazem made her legs feel better but she thinks it caused issues with her ear.  She has been struggling with the hearing in her R ear.  It finally seems to be improving.  It is unclear what was causing it.  Changing her allergy medicine seemed to help.  She also struggled with  dizziness.  She hasn't been experiencing any palpitaitons.  She wonders if the diltiazem contributed to her hearing issues.  Since her last appointment Sheri Lopez saw Dr. Curt Bears 05/2019 and her flecainide was reduced due to dizziness.  She extremity edema.  She wears compression stockings in the winter but not during the summer.  She exercises regularly.  She does a swim class three days per week.  She also tries to walk daily.  She feels good with exercise.  She occasionally has shortness of breath walking up the steps.  She checks her blood pressure regularly.  She notes that when she is taking diltiazem it was well-controlled.  Lately it tends to be in the [id number] systolic.   Past Medical History:  Diagnosis Date   Allergic rhinitis    Anxiety    Atrial tachycardia ([place]) 01/27/2017   Atypical nevus 03/27/2016   Left Mid Back-Moderate to Severe(w/s widershave)   Atypical nevus 08/31/2017   Right Wrist-Atypical Proliferation ([place])   Atypical nevus 04/12/2018   Right Upperarm-Atypical Proliferation ([place])   Bradycardia    Cancer ([facility])    breast   Essential hypertension 12/08/2016   Fatigue 12/08/2016   Frozen shoulder    Heart murmur    Hyperlipidemia 12/08/2016   Hypertension    Lower extremity edema 12/08/2016   Palpitations 12/08/2016   Psoriasis    Retinitis pigmentosa    Shortness of breath 12/08/2016   Thyroid disease  Venous insufficiency     Past Surgical History:  Procedure Laterality Date   BREAST EXCISIONAL BIOPSY Right    BREAST EXCISIONAL BIOPSY Left 2013     Current Outpatient Medications  Medication Sig Dispense Refill   albuterol (PROVENTIL HFA;VENTOLIN HFA) 108 (90 Base) MCG/ACT inhaler Inhale 2 puffs into the lungs every 4 (four) hours as needed for shortness of breath. 1 Inhaler 2   Azelastine-Fluticasone 137-50 MCG/ACT SUSP Place 1 puff into the nose 2 (two) times daily.     clobetasol cream (TEMOVATE) [id number] % Apply 1  application topically as directed.     famotidine (PEPCID) 20 MG tablet Take 20 mg by mouth 2 (two) times daily.     flecainide (TAMBOCOR) 50 MG tablet Take 1 tablet (50 mg total) by mouth 2 (two) times daily. 180 tablet 1   ibuprofen (ADVIL,MOTRIN) 200 MG tablet Take 600 mg by mouth every 6 (six) hours as needed (tooth pain).     levocetirizine (XYZAL) 5 MG tablet SMARTSIG:1 Tablet(s) By Mouth Every Evening     levothyroxine (SYNTHROID, LEVOTHROID) 75 MCG tablet Take 75 mcg by mouth daily before breakfast.     propranolol (INDERAL) 10 MG tablet Take 1 tablet (10 mg total) by mouth 3 (three) times daily as needed. For palpitations/heart rates greater than 100 bpm. 30 tablet 0   sertraline (ZOLOFT) 50 MG tablet Take 50 mg by mouth daily.     UNKNOWN TO PATIENT      valsartan-hydrochlorothiazide (DIOVAN-HCT) 320-25 MG tablet Take 1 tablet by mouth daily. 90 tablet 3   No current facility-administered medications for this visit.    Allergies:   Keflex [cephalexin]    Social History:  The patient  reports that she has never smoked. She has never used smokeless tobacco.   Family History:  [name] history includes Arrhythmia in her mother; Breast cancer in her maternal aunt; CAD in her father; Cancer in her father and mother; Heart attack in her maternal grandfather and paternal grandfather; Heart failure in her paternal grandmother; Hyperlipidemia in her mother; Hypertension in her brother, mother, and sister.    ROS:  Please see the history of present illness.   Otherwise, review of systems are positive for none.   All other systems are reviewed and negative.    PHYSICAL EXAM: VS:  BP 132/62    Pulse (!) 56    Ht 5' 7.5" (1.715 m)    Wt 196 lb (88.9 kg)    SpO2 99%    BMI 30.24 kg/m  , BMI Body mass index is 30.24 kg/m. GENERAL:  Well appearing HEENT: Pupils equal round and reactive, fundi not visualized, oral mucosa unremarkable NECK:  No jugular venous distention,  waveform within normal limits, carotid upstroke brisk and symmetric, no bruits LUNGS:  Clear to auscultation bilaterally HEART:  RRR.  PMI not displaced or sustained,S1 and S2 within normal limits, no S3, no S4, no clicks, no rubs, no murmurs ABD:  Flat, positive bowel sounds normal in frequency in pitch, no bruits, no rebound, no guarding, no midline pulsatile mass, no hepatomegaly, no splenomegaly EXT:  2 plus pulses throughout, no edema, no cyanosis no clubbing SKIN:  No rashes no nodules NEURO:  Cranial nerves II through XII grossly intact, motor grossly intact throughout PSYCH:  Cognitively intact, oriented to person place and time   EKG:  EKG is ordered today. The ekg ordered today demonstrates Sinus bradycardia. Rate 57 bpm. PVCs. Low-voltage 07/18/11: 11/10/16: Sinus bradycardia. Rate 48  bpm. 04/05/17: Sinus bradycardia.  Rate 53 bpm.  Low voltage precordial and limb leads.   07/25/18: Sinus bradycardia.  Rate 51 bpm.  Low voltage.  Prior inferior infarct.  Prior septal infarct. 09/08/19: Sinus bradycardia.  Rate 53 bpm.Low voltage.  Septal infarct.  Lexiscan Myoview 12/10/16::  Normal perfusion No ischemia  Nuclear stress EF: 59%.  Low risk study   Echo 12/21/16:  Study Conclusions  - Left ventricle: The cavity size was normal. Systolic function was   normal. The estimated ejection fraction was in the range of 55%   to 60%. Wall motion was normal; there were no regional wall   motion abnormalities. Left ventricular diastolic function   parameters were normal.  14 Day Event Monitor 01/04/17:  Quality: Fair.  Baseline artifact. Predominant rhythm: sinus rhythm, sinus bradycardia Average heart rate: 62 bpm  Sinus rhythm and sinus bradycardia  PACs, PVCs Atrial tachycardia  Recent Labs: No results found for requested labs within last 8760 hours.   08/24/16: Sodium 141, potassium 4.1, BUN 17, creatinine 0.94 AST 16, ALT 15 TSH 2.54 Total cholesterol 229,  triglycerides 139, HDL 79, LDL 123  07/25/2018:  Sodium 138, potassium 3.4, BUN 14, creatinine 1.08 AST 14, ALT 11, total bilirubin 0.5, direct bili 0.1 Troponin less than 0.01 WBC 10.8, hemoglobin 13.6, hematocrit 40.1, platelets 339  Lipid Panel No results found for: CHOL, TRIG, HDL, CHOLHDL, VLDL, LDLCALC, LDLDIRECT    Wt Readings from Last 3 Encounters:  09/08/19 196 lb (88.9 kg)  05/16/19 198 lb (89.8 kg)  07/29/18 200 lb (90.7 kg)      ASSESSMENT AND PLAN:  # Hypertension: BP slightly above goal.  She is going to start back exercising more.  We will stop hydrochlorothiazide/losartan and start hydrochlorothiazide/valsartan 1025/20 mg.  She will get a basic metabolic panel in a week.    # Atrial tachycardia: # PACs/PVCs: Controlled on flecainide.  She has not been needing to take.  Propranolol.  # Hyperlipidemia: ASCVD 10 year risk is 4.3%. No aspirin or statin at this time. Continue to work on diet and exercise.      Current medicines are reviewed at length with the patient today.  The patient does not have concerns regarding medicines.  The following changes have been made:  Switch losartan to valsartan  Labs/ tests ordered today include:   Orders Placed This Encounter  Procedures   Basic metabolic panel     Disposition:   FU with Nakai Yard C. [name], MD, [facility] in 3 months    Signed, Laveda Demedeiros C. [name], MD, [facility]  09/08/2019 12:44 PM    [facility]

## 2019-09-08 ENCOUNTER — Ambulatory Visit: Payer: Medicare Other | Admitting: Cardiovascular Disease

## 2019-09-08 ENCOUNTER — Encounter: Payer: Self-pay | Admitting: Cardiovascular Disease

## 2019-09-08 ENCOUNTER — Other Ambulatory Visit: Payer: Self-pay

## 2019-09-08 VITALS — BP 132/62 | HR 56 | Ht 67.5 in | Wt 196.0 lb

## 2019-09-08 DIAGNOSIS — I471 Supraventricular tachycardia: Secondary | ICD-10-CM

## 2019-09-08 DIAGNOSIS — I10 Essential (primary) hypertension: Secondary | ICD-10-CM | POA: Diagnosis not present

## 2019-09-08 DIAGNOSIS — Z5181 Encounter for therapeutic drug level monitoring: Secondary | ICD-10-CM

## 2019-09-08 DIAGNOSIS — R001 Bradycardia, unspecified: Secondary | ICD-10-CM | POA: Diagnosis not present

## 2019-09-08 DIAGNOSIS — I4719 Other supraventricular tachycardia: Secondary | ICD-10-CM

## 2019-09-08 DIAGNOSIS — E785 Hyperlipidemia, unspecified: Secondary | ICD-10-CM

## 2019-09-08 MED ORDER — VALSARTAN-HYDROCHLOROTHIAZIDE 320-25 MG PO TABS: 1.0000 | ORAL_TABLET | Freq: Every day | ORAL | 3 refills | Status: DC

## 2019-09-08 NOTE — Addendum Note (Signed)
Addended by: Earvin Hansen on: 09/08/2019 06:38 PM   Modules accepted: Orders

## 2019-09-08 NOTE — Patient Instructions (Signed)
Medication Instructions:  STOP LOSARTAN-HCT  STOP HYDROCHLOROTHIAZIDE   START VALSARTAN HCT 320-25 MG DAILY   *If you need a refill on your cardiac medications before your next appointment, please call your pharmacy*  Lab Work: BMET IN 1 WEEK   If you have labs (blood work) drawn today and your tests are completely normal, you will receive your results only by: Marland Kitchen MyChart Message (if you have MyChart) OR . A paper copy in the mail If you have any lab test that is abnormal or we need to change your treatment, we will call you to review the results.  Testing/Procedures: NONE   Follow-Up: At [facility], you and your health needs are our priority.  As part of our continuing mission to provide you with exceptional heart care, we have created designated Provider Care Teams.  These Care Teams include your primary Cardiologist (physician) and Advanced Practice Providers (APPs -  Physician Assistants and Nurse Practitioners) who all work together to provide you with the care you need, when you need it.  We recommend signing up for the patient portal called "MyChart".  Sign up information is provided on this After Visit Summary.  MyChart is used to connect with patients for Virtual Visits (Telemedicine).  Patients are able to view lab/test results, encounter notes, upcoming appointments, etc.  Non-urgent messages can be sent to your provider as well.   To learn more about what you can do with MyChart, go to [email or web address].    Your next appointment:   2 month(s) OK TO HAVE SOMEONE COME WITH YOU TO YOUR FOLLOW UP VISITS   The format for your next appointment:   In Person  Provider:   You may see DR [facility]   or one of the following Advanced Practice Providers on your designated Care Team:    Kerin Ransom, PA-C  [place], [place]  Coletta Memos, [place]

## 2019-09-15 DIAGNOSIS — Z5181 Encounter for therapeutic drug level monitoring: Secondary | ICD-10-CM | POA: Diagnosis not present

## 2019-09-15 DIAGNOSIS — N3001 Acute cystitis with hematuria: Secondary | ICD-10-CM | POA: Diagnosis not present

## 2019-09-15 DIAGNOSIS — R300 Dysuria: Secondary | ICD-10-CM | POA: Diagnosis not present

## 2019-09-15 DIAGNOSIS — I10 Essential (primary) hypertension: Secondary | ICD-10-CM | POA: Diagnosis not present

## 2019-09-15 LAB — BASIC METABOLIC PANEL
BUN/Creatinine Ratio: 21 (ref 12–28)
BUN: 19 mg/dL (ref 8–27)
CO2: 25 mmol/L (ref 20–29)
Calcium: 9.5 mg/dL (ref 8.7–10.3)
Chloride: 99 mmol/L (ref 96–106)
Creatinine, Ser: 0.91 mg/dL (ref 0.57–1.00)
GFR calc Af Amer: 77 mL/min/{1.73_m2} (ref >59)
GFR calc non Af Amer: 67 mL/min/{1.73_m2} (ref >59)
Glucose: 87 mg/dL (ref 65–99)
Potassium: 4.6 mmol/L (ref 3.5–5.2)
Sodium: 140 mmol/L (ref 134–144)

## 2019-11-07 ENCOUNTER — Other Ambulatory Visit: Payer: Self-pay

## 2019-11-07 ENCOUNTER — Ambulatory Visit: Payer: Medicare Other | Admitting: Cardiology

## 2019-11-07 ENCOUNTER — Encounter: Payer: Self-pay | Admitting: Cardiology

## 2019-11-07 VITALS — BP 124/68 | HR 60 | Ht 68.0 in | Wt 192.0 lb

## 2019-11-07 DIAGNOSIS — I471 Supraventricular tachycardia: Secondary | ICD-10-CM | POA: Diagnosis not present

## 2019-11-07 DIAGNOSIS — R42 Dizziness and giddiness: Secondary | ICD-10-CM | POA: Diagnosis not present

## 2019-11-07 DIAGNOSIS — H539 Unspecified visual disturbance: Secondary | ICD-10-CM | POA: Diagnosis not present

## 2019-11-07 DIAGNOSIS — G8929 Other chronic pain: Secondary | ICD-10-CM | POA: Diagnosis not present

## 2019-11-07 DIAGNOSIS — R519 Headache, unspecified: Secondary | ICD-10-CM | POA: Diagnosis not present

## 2019-11-07 NOTE — Patient Instructions (Signed)
Medication Instructions:  Your physician recommends that you continue on your current medications as directed. Please refer to the Current Medication list given to you today.  *If you need a refill on your cardiac medications before your next appointment, please call your pharmacy*   Lab Work: None ordered If you have labs (blood work) drawn today and your tests are completely normal, you will receive your results only by: . MyChart Message (if you have MyChart) OR . A paper copy in the mail If you have any lab test that is abnormal or we need to change your treatment, we will call you to review the results.   Testing/Procedures: None ordered   Follow-Up: At [facility], you and your health needs are our priority.  As part of our continuing mission to provide you with exceptional heart care, we have created designated Provider Care Teams.  These Care Teams include your primary Cardiologist (physician) and Advanced Practice Providers (APPs -  Physician Assistants and Nurse Practitioners) who all work together to provide you with the care you need, when you need it.  We recommend signing up for the patient portal called "MyChart".  Sign up information is provided on this After Visit Summary.  MyChart is used to connect with patients for Virtual Visits (Telemedicine).  Patients are able to view lab/test results, encounter notes, upcoming appointments, etc.  Non-urgent messages can be sent to your provider as well.   To learn more about what you can do with MyChart, go to [email or web address].    Your next appointment:   6 month(s)  The format for your next appointment:   In Person  Provider:   Will Camnitz, MD   Thank you for choosing [facility]!!   Sian Rockers, RN [phone]    Other Instructions    

## 2019-11-07 NOTE — Progress Notes (Signed)
Electrophysiology Office Note   Date:  11/07/2019   ID:  Sheri Lopez, DOB [birth year], MRN [id number]  PCP:  Carolee Rota, NP  Cardiologist:  [name] Primary Electrophysiologist:  Tyneka Scafidi [name], MD    No chief complaint on file.    History of Present Illness: Sheri Lopez is a 64 y.o. female who is being seen today for the evaluation of SVT at the request of Carolee Rota, NP. Presenting today for electrophysiology evaluation. She has a history of hypertension, bradycardia, legal blindness, and breast cancer. She was no I think she has a lazy eye Ted. Bradycardic by her PCP with heart rates of 44 on EKG. She has had an echo that showed normal systolic function and a Myoview with normal function and no ischemia. She had palpitations and wore a 14 day monitor that showed PACs, PVCs and atrial tachycardia. Her average heart rate was 62.  She was put on both metoprolol and flecainide at her last visit.  Today, denies symptoms of palpitations, chest pain, shortness of breath, orthopnea, PND, lower extremity edema, claudication, dizziness, presyncope, syncope, bleeding, or neurologic sequela. The patient is tolerating medications without difficulties.  Overall she is doing well.  She has had minimal episodes of SVT over the last few months.  She has no chest pain or shortness of breath.  She is able to do all of her daily activities.  Past Medical History:  Diagnosis Date  . Allergic rhinitis   . Anxiety   . Atrial tachycardia ([place]) 01/27/2017  . Atypical nevus 03/27/2016   Left Mid Back-Moderate to Severe(w/s widershave)  . Atypical nevus 08/31/2017   Right Wrist-Atypical Proliferation ([place])  . Atypical nevus 04/12/2018   Right Upperarm-Atypical Proliferation ([place])  . Bradycardia   . Cancer [facility])    breast  . Essential hypertension 12/08/2016  . Fatigue 12/08/2016  . Frozen shoulder   . Heart murmur   . Hyperlipidemia 12/08/2016  . Hypertension   . Lower  extremity edema 12/08/2016  . Palpitations 12/08/2016  . Psoriasis   . Retinitis pigmentosa   . Shortness of breath 12/08/2016  . Thyroid disease   . Venous insufficiency    Past Surgical History:  Procedure Laterality Date  . BREAST EXCISIONAL BIOPSY Right   . BREAST EXCISIONAL BIOPSY Left 2013     Current Outpatient Medications  Medication Sig Dispense Refill  . albuterol (PROVENTIL HFA;VENTOLIN HFA) 108 (90 Base) MCG/ACT inhaler Inhale 2 puffs into the lungs every 4 (four) hours as needed for shortness of breath. 1 Inhaler 2  . AYR SALINE NASAL NA Nasal wash    . cholecalciferol (VITAMIN D3) 25 MCG (1000 UNIT) tablet Take 2,000 Units by mouth daily.    . clobetasol cream (TEMOVATE) 5.63 % Apply 1 application topically as directed.    . Cyanocobalamin (B-12 PO) Take 2 capsules by mouth daily.    . famotidine (PEPCID) 20 MG tablet Take 20 mg by mouth 2 (two) times daily.    . fexofenadine (ALLEGRA) 180 MG tablet Take 180 mg by mouth daily.    . flecainide (TAMBOCOR) 50 MG tablet Take 25 mg by mouth 2 (two) times daily.    . fluticasone (FLONASE) 50 MCG/ACT nasal spray Place 1 spray into both nostrils daily.    Marland Kitchen ibuprofen (ADVIL,MOTRIN) 200 MG tablet Take 600 mg by mouth every 6 (six) hours as needed (tooth pain).    Marland Kitchen levothyroxine (SYNTHROID, LEVOTHROID) 75 MCG tablet Take 75 mcg by mouth daily before breakfast.    .  propranolol (INDERAL) 10 MG tablet Take 1 tablet (10 mg total) by mouth 3 (three) times daily as needed. For palpitations/heart rates greater than 100 bpm. 30 tablet 0  . sertraline (ZOLOFT) 50 MG tablet Take 50 mg by mouth daily.    . valsartan-hydrochlorothiazide (DIOVAN-HCT) 320-25 MG tablet Take 1 tablet by mouth daily. 90 tablet 3   No current facility-administered medications for this visit.    Allergies:   Keflex [cephalexin]   Social History:  The patient  reports that she has never smoked. She has never used smokeless tobacco.   Family History:  The  patient's family history includes Arrhythmia in her mother; Breast cancer in her maternal aunt; CAD in her father; Cancer in her father and mother; Heart attack in her maternal grandfather and paternal grandfather; Heart failure in her paternal grandmother; Hyperlipidemia in her mother; Hypertension in her brother, mother, and sister.    ROS:  Please see the history of present illness.   Otherwise, review of systems is positive for none.   All other systems are reviewed and negative.   PHYSICAL EXAM: VS:  BP 124/68   Pulse 60   Ht 5\' 8"  (1.727 m)   Wt 192 lb (87.1 kg)   SpO2 95%   BMI 29.19 kg/m  , BMI Body mass index is 29.19 kg/m. GEN: Well nourished, well developed, in no acute distress  HEENT: normal  Neck: no JVD, carotid bruits, or masses Cardiac: RRR; no murmurs, rubs, or gallops,no edema  Respiratory:  clear to auscultation bilaterally, normal work of breathing GI: soft, nontender, nondistended, + BS MS: no deformity or atrophy  Skin: warm and dry Neuro:  Strength and sensation are intact Psych: euthymic mood, full affect  EKG:  EKG is ordered today. Personal review of the ekg ordered shows sinus rhythm, rate 53  Recent Labs: 09/15/2019: BUN 19; Creatinine, Ser 0.91; Potassium 4.6; Sodium 140    Lipid Panel  No results found for: CHOL, TRIG, HDL, CHOLHDL, VLDL, LDLCALC, LDLDIRECT   Wt Readings from Last 3 Encounters:  11/07/19 192 lb (87.1 kg)  09/08/19 196 lb (88.9 kg)  05/16/19 198 lb (89.8 kg)      Other studies Reviewed: Additional studies/ records that were reviewed today include: Myoview 12/10/16  Review of the above records today demonstrates:   Normal perfusion No ischemia  Nuclear stress EF: 59%.  Low risk study  TTE 12/21/16 - Left ventricle: The cavity size was normal. Systolic function was   normal. The estimated ejection fraction was in the range of 55%   to 60%. Wall motion was normal; there were no regional wall   motion abnormalities. Left  ventricular diastolic function   parameters were normal.  Cardiac monitor 01/26/17 - personally reviewed Predominant rhythm: sinus rhythm, sinus bradycardia Average heart rate: 62 bpm  Sinus rhythm and sinus bradycardia  PACs, PVCs Atrial tachycardia  ETT 02/17/17  Blood pressure demonstrated a normal response to exercise.  Nonspecific ST changes noted during exercise. Immediate recovery there was 75mm of horizontal ST segment depression in the inferolateral leads  Abnormal exercise stress test in recovery. The patient had no symptoms during the stress test.  Submaximal ETT with patient achieving only 74% maximum predicted heart rate. The patient achieved 9.1 mets.  Recommed pharmacolgic myocardial perfusion scan.  ASSESSMENT AND PLAN:  1.  Atrial tachycardia: Currently on propranolol and flecainide.  She has had minimal episodes over the last few months.  No changes.  2.  Fatigue: Much improved  on diltiazem at home metoprolol  3. Hyperlipidemia: Controlled  Current medicines are reviewed at length with the patient today.   The patient does not have concerns regarding her medicines.  The following changes were made today: None  Labs/ tests ordered today include:  Orders Placed This Encounter  Procedures  . EKG 12-Lead     Disposition:   FU with Roshawn Lacina 6 months  Signed, Lucy Woolever [name], MD  11/07/2019 3:02 PM     [address] [place] [address] [phone] (office) [phone] (fax)

## 2019-11-11 NOTE — Progress Notes (Signed)
Cardiology Office Note   Date:  11/13/2019   ID:  Sheri Lopez, DOB [birth year], MRN [id number]  PCP:  Carolee Rota, NP  Cardiologist:  Dr. [name] No chief complaint on file.    History of Present Illness: Sheri Lopez is a 64 y.o. female who presents for ongoing assessment and management of hypertension, bradycardia, with other history to include legal blindness and breast cancer.  She was initially referred in 2018 for echocardiogram which revealed an LVEF of 55 to 60% with no significant valvular abnormalities, a heart murmur was auscultated by her PCP.  At that time the patient reported palpitations and a 14-day event monitor revealed PACs PVCs and atrial tachycardia.  Her average heart rate was 62 bpm without significant pauses.  She also complained of persistent shortness of breath.  PFT showed minimal airway obstruction.  She was given a trial of albuterol, HCT was discontinued due to hypotension.  She continues to have palpitations but was too bradycardic for nodal agents.  She was followed by EP, Dr. Curt Bears, and was found to be a candidate for both ablation and antiarrhythmics.  She was started on metoprolol 12.5 mg, and flecainide.  Metoprolol was switched to diltiazem due to fatigue.  She was last seen by Dr. Curt Bears on 11/07/2019, for evaluation of SVT symptoms.  At that office visit she denied symptoms of palpitation chest pain or shortness of breath along with orthopnea or PND.  No changes were made in her medication regimen.  Her main complaint today is right ear pain.  She has been seen by ENT and was not found to have any abnormalities.  She is going to be seen by a neurologist.  She is very sensitive to sound in that right ear with pain associated.  She is medically compliant.  She states that during the summer months with humidity it is difficult for her to breathe.  She enjoys playing golf but in the summer will have to use a golf cart because she cannot walk in  the heat and humidity without becoming significantly fatigued.   Past Medical History:  Diagnosis Date  . Allergic rhinitis   . Anxiety   . Atrial tachycardia ([place]) 01/27/2017  . Atypical nevus 03/27/2016   Left Mid Back-Moderate to Severe(w/s widershave)  . Atypical nevus 08/31/2017   Right Wrist-Atypical Proliferation ([place])  . Atypical nevus 04/12/2018   Right Upperarm-Atypical Proliferation ([place])  . Bradycardia   . Cancer [facility])    breast  . Essential hypertension 12/08/2016  . Fatigue 12/08/2016  . Frozen shoulder   . Heart murmur   . Hyperlipidemia 12/08/2016  . Hypertension   . Lower extremity edema 12/08/2016  . Palpitations 12/08/2016  . Psoriasis   . Retinitis pigmentosa   . Shortness of breath 12/08/2016  . Thyroid disease   . Venous insufficiency     Past Surgical History:  Procedure Laterality Date  . BREAST EXCISIONAL BIOPSY Right   . BREAST EXCISIONAL BIOPSY Left 2013     Current Outpatient Medications  Medication Sig Dispense Refill  . albuterol (PROVENTIL HFA;VENTOLIN HFA) 108 (90 Base) MCG/ACT inhaler Inhale 2 puffs into the lungs every 4 (four) hours as needed for shortness of breath. 1 Inhaler 2  . AYR SALINE NASAL NA Nasal wash    . cholecalciferol (VITAMIN D3) 25 MCG (1000 UNIT) tablet Take 2,000 Units by mouth daily.    . clobetasol cream (TEMOVATE) 5.46 % Apply 1 application topically as directed.    . Cyanocobalamin (  B-12 PO) Take 2 capsules by mouth daily.    . famotidine (PEPCID) 20 MG tablet Take 20 mg by mouth 2 (two) times daily.    . fexofenadine (ALLEGRA) 180 MG tablet Take 180 mg by mouth daily.    . flecainide (TAMBOCOR) 50 MG tablet Take 25 mg by mouth 2 (two) times daily.    . fluticasone (FLONASE) 50 MCG/ACT nasal spray Place 1 spray into both nostrils daily.    Marland Kitchen ibuprofen (ADVIL,MOTRIN) 200 MG tablet Take 600 mg by mouth every 6 (six) hours as needed (tooth pain).    Marland Kitchen levothyroxine (SYNTHROID, LEVOTHROID) 75 MCG tablet Take  75 mcg by mouth daily before breakfast.    . propranolol (INDERAL) 10 MG tablet Take 1 tablet (10 mg total) by mouth 3 (three) times daily as needed. For palpitations/heart rates greater than 100 bpm. 30 tablet 0  . sertraline (ZOLOFT) 50 MG tablet Take 50 mg by mouth daily.    . valsartan-hydrochlorothiazide (DIOVAN-HCT) 320-25 MG tablet Take 1 tablet by mouth daily. 90 tablet 3   No current facility-administered medications for this visit.    Allergies:   Keflex [cephalexin]    Social History:  The patient  reports that she has never smoked. She has never used smokeless tobacco.   Family History:  [name] history includes Arrhythmia in her mother; Breast cancer in her maternal aunt; CAD in her father; Cancer in her father and mother; Heart attack in her maternal grandfather and paternal grandfather; Heart failure in her paternal grandmother; Hyperlipidemia in her mother; Hypertension in her brother, mother, and sister.    ROS: All other systems are reviewed and negative. Unless otherwise mentioned in H&P    PHYSICAL EXAM: VS:  BP 134/78   Pulse 62   Ht 5' 7.5" (1.715 m)   Wt 194 lb 9.6 oz (88.3 kg)   SpO2 97%   BMI 30.03 kg/m  , BMI Body mass index is 30.03 kg/m. GEN: Well nourished, well developed, in no acute distress HEENT: normal Neck: no JVD, carotid bruits, or masses Cardiac: RRR; no murmurs, rubs, or gallops,no edema.  Bilateral abdominal bruits 1/6, noted just proximal to the femoral arteries with radiation into the femoral arteries.  No aorta bruit auscultated she has excellent pulses. Respiratory:  Clear to auscultation bilaterally, normal work of breathing GI: soft, nontender, nondistended, + BS MS: no deformity or atrophy Skin: warm and dry, no rash Neuro:  Strength and sensation are intact.  She is legally blind and uses a cane for assistance. Psych: euthymic mood, full affect   EKG: Not completed this office visit  Recent Labs: 09/15/2019: BUN  19; Creatinine, Ser 0.91; Potassium 4.6; Sodium 140    Lipid Panel No results found for: CHOL, TRIG, HDL, CHOLHDL, VLDL, LDLCALC, LDLDIRECT    Wt Readings from Last 3 Encounters:  11/13/19 194 lb 9.6 oz (88.3 kg)  11/07/19 192 lb (87.1 kg)  09/08/19 196 lb (88.9 kg)      Other studies Reviewed: Myoview 12/10/16  Review of the above records today demonstrates:   Normal perfusion No ischemia  Nuclear stress EF: 59%.  Low risk study  TTE 12/21/16 - Left ventricle: The cavity size was normal. Systolic function was normal. The estimated ejection fraction was in the range of 55% to 60%. Wall motion was normal; there were no regional wall motion abnormalities. Left ventricular diastolic function parameters were normal.  Cardiac monitor 01/26/17 - personally reviewed Predominant rhythm: sinus rhythm, sinus bradycardia Average  heart rate: 62 bpm  Sinus rhythm and sinus bradycardia  PACs, PVCs Atrial tachycardia  ETT 02/17/17  Blood pressure demonstrated a normal response to exercise.  Nonspecific ST changes noted during exercise. Immediate recovery there was 32mm of horizontal ST segment depression in the inferolateral leads  Abnormal exercise stress test in recovery. The patient had no symptoms during the stress test.  Submaximal ETT with patient achieving only 74% maximum predicted heart rate. The patient achieved 9.1 mets.  Recommed pharmacolgic myocardial perfusion scan.   ASSESSMENT AND PLAN:  1.  Hypertension: I have rechecked her blood pressure in the exam room and it is now 116/68.  She will continue her current medication regimen without addition of new antihypertensives.  I have given her refills on valsartan HCTZ 320 mg / 25 mg in 90-day supply.  She is due to see her PCP next week for annual labs.  2.  Bilateral abdominal bruits: Noted in the iliac area just proximal to the femoral arteries.  This is a 1/6 bruit.  She denies any pain in her legs but  she does have easy fatigue with long distances in her legs.  I am going to order an abdominal ultrasound to evaluate more thoroughly.  I have explained this to her and she is willing to proceed.  3. Thyroid disease: Being followed by PCP   Current medicines are reviewed at length with the patient today.  I have spent 30 minutes dedicated to the care of this patient on the date of this encounter to include pre-visit review of records, assessment, management and diagnostic testing,with shared decision making.  Labs/ tests ordered today include: Abdominal ultrasound.  Phill Myron. West Pugh, ANP, AACC   11/13/2019 10:10 AM    [address] Office [phone] Fax [phone]  Notice: This dictation was prepared with Dragon dictation along with smaller phrase technology. Any transcriptional errors that result from this process are unintentional and may not be corrected upon review.

## 2019-11-13 ENCOUNTER — Other Ambulatory Visit: Payer: Self-pay

## 2019-11-13 ENCOUNTER — Encounter: Payer: Self-pay | Admitting: Adult Health

## 2019-11-13 ENCOUNTER — Ambulatory Visit: Payer: Medicare Other | Admitting: Adult Health

## 2019-11-13 VITALS — BP 134/78 | HR 62 | Ht 67.5 in | Wt 194.6 lb

## 2019-11-13 DIAGNOSIS — I10 Essential (primary) hypertension: Secondary | ICD-10-CM

## 2019-11-13 DIAGNOSIS — E785 Hyperlipidemia, unspecified: Secondary | ICD-10-CM

## 2019-11-13 DIAGNOSIS — I471 Supraventricular tachycardia: Secondary | ICD-10-CM | POA: Diagnosis not present

## 2019-11-13 DIAGNOSIS — R0989 Other specified symptoms and signs involving the circulatory and respiratory systems: Secondary | ICD-10-CM | POA: Diagnosis not present

## 2019-11-13 MED ORDER — VALSARTAN-HYDROCHLOROTHIAZIDE 320-25 MG PO TABS: 1.0000 | ORAL_TABLET | Freq: Every day | ORAL | 3 refills | Status: DC

## 2019-11-13 NOTE — Patient Instructions (Signed)
Medication Instructions:  Your physician recommends that you continue on your current medications as directed. Please refer to the Current Medication list given to you today.  *If you need a refill on your cardiac medications before your next appointment, please call your pharmacy*    Testing/Procedures: Your physician has requested that you have an abdominal aorta duplex. During this test, an ultrasound is used to evaluate the aorta. Allow 30 minutes for this exam. Do not eat after midnight the day before and avoid carbonated beverages   Follow-Up: At [facility], you and your health needs are our priority.  As part of our continuing mission to provide you with exceptional heart care, we have created designated Provider Care Teams.  These Care Teams include your primary Cardiologist (physician) and Advanced Practice Providers (APPs -  Physician Assistants and Nurse Practitioners) who all work together to provide you with the care you need, when you need it.  We recommend signing up for the patient portal called "MyChart".  Sign up information is provided on this After Visit Summary.  MyChart is used to connect with patients for Virtual Visits (Telemedicine).  Patients are able to view lab/test results, encounter notes, upcoming appointments, etc.  Non-urgent messages can be sent to your provider as well.   To learn more about what you can do with MyChart, go to [email or web address].    Your next appointment:   6 month(s)  The format for your next appointment:   In Person  Provider:   You may see Skeet Latch, MD or one of the following Advanced Practice Providers on your designated Care Team:    Kerin Ransom, PA-C  [place], [place]  Coletta Memos, [place]    Other Instructions Please call our office 2 months in advance to schedule your follow-up appointment with Dr. [name].

## 2019-11-21 DIAGNOSIS — I10 Essential (primary) hypertension: Secondary | ICD-10-CM | POA: Diagnosis not present

## 2019-11-21 DIAGNOSIS — R42 Dizziness and giddiness: Secondary | ICD-10-CM | POA: Diagnosis not present

## 2019-11-21 DIAGNOSIS — Z0000 Encounter for general adult medical examination without abnormal findings: Secondary | ICD-10-CM | POA: Diagnosis not present

## 2019-11-21 DIAGNOSIS — E039 Hypothyroidism, unspecified: Secondary | ICD-10-CM | POA: Diagnosis not present

## 2019-11-22 DIAGNOSIS — E039 Hypothyroidism, unspecified: Secondary | ICD-10-CM | POA: Diagnosis not present

## 2019-11-22 DIAGNOSIS — J4520 Mild intermittent asthma, uncomplicated: Secondary | ICD-10-CM | POA: Diagnosis not present

## 2019-11-22 DIAGNOSIS — I10 Essential (primary) hypertension: Secondary | ICD-10-CM | POA: Diagnosis not present

## 2019-11-22 DIAGNOSIS — N183 Chronic kidney disease, stage 3 unspecified: Secondary | ICD-10-CM | POA: Diagnosis not present

## 2019-11-24 DIAGNOSIS — H3552 Pigmentary retinal dystrophy: Secondary | ICD-10-CM | POA: Diagnosis not present

## 2019-11-24 DIAGNOSIS — H26412 Soemmering's ring, left eye: Secondary | ICD-10-CM | POA: Diagnosis not present

## 2019-11-24 DIAGNOSIS — H2181 Floppy iris syndrome: Secondary | ICD-10-CM | POA: Diagnosis not present

## 2019-12-12 ENCOUNTER — Other Ambulatory Visit: Payer: Self-pay

## 2019-12-12 ENCOUNTER — Ambulatory Visit (HOSPITAL_COMMUNITY)
Admission: RE | Admit: 2019-12-12 | Discharge: 2019-12-12 | Disposition: A | Discharge status: Home / Self Care | Payer: Medicare Other | Source: Ambulatory Visit | Attending: Cardiology | Admitting: Cardiology

## 2019-12-12 DIAGNOSIS — R0989 Other specified symptoms and signs involving the circulatory and respiratory systems: Secondary | ICD-10-CM

## 2019-12-14 ENCOUNTER — Telehealth: Payer: Self-pay | Admitting: *Deleted

## 2019-12-14 DIAGNOSIS — I739 Peripheral vascular disease, unspecified: Secondary | ICD-10-CM

## 2019-12-14 NOTE — Telephone Encounter (Signed)
-----   Message from Lendon Colonel, NP sent at 12/13/2019  4:43 PM EDT ----- Please refer her to [facility] for further testing/recommendations of bilateral external iliac stenosis  of > 50 % noted on abdominal ultrasound.

## 2019-12-14 NOTE — Telephone Encounter (Signed)
LEA and ABI ordered for pt to get down, send to scheduler to be schedule along with f/u visit with Dr Fletcher Anon

## 2019-12-21 ENCOUNTER — Other Ambulatory Visit: Payer: Self-pay | Admitting: Cardiovascular Disease

## 2020-01-02 ENCOUNTER — Encounter: Payer: Self-pay | Admitting: *Deleted

## 2020-01-03 ENCOUNTER — Other Ambulatory Visit: Payer: Self-pay

## 2020-01-03 ENCOUNTER — Ambulatory Visit: Payer: Medicare Other | Admitting: Diagnostic Neuroimaging

## 2020-01-03 ENCOUNTER — Encounter: Payer: Self-pay | Admitting: Diagnostic Neuroimaging

## 2020-01-03 VITALS — BP 115/78 | HR 66 | Ht 67.0 in | Wt 194.6 lb

## 2020-01-03 DIAGNOSIS — J4520 Mild intermittent asthma, uncomplicated: Secondary | ICD-10-CM | POA: Diagnosis not present

## 2020-01-03 DIAGNOSIS — E039 Hypothyroidism, unspecified: Secondary | ICD-10-CM | POA: Diagnosis not present

## 2020-01-03 DIAGNOSIS — N183 Chronic kidney disease, stage 3 unspecified: Secondary | ICD-10-CM | POA: Diagnosis not present

## 2020-01-03 DIAGNOSIS — G43109 Migraine with aura, not intractable, without status migrainosus: Secondary | ICD-10-CM

## 2020-01-03 DIAGNOSIS — I10 Essential (primary) hypertension: Secondary | ICD-10-CM | POA: Diagnosis not present

## 2020-01-03 MED ORDER — TOPIRAMATE 50 MG PO TABS: 50.0000 mg | ORAL_TABLET | Freq: Two times a day (BID) | ORAL | 12 refills | Status: DC

## 2020-01-03 MED ORDER — RIZATRIPTAN BENZOATE 10 MG PO TBDP: 10.0000 mg | ORAL_TABLET | ORAL | 11 refills | Status: DC | PRN

## 2020-01-03 NOTE — Patient Instructions (Addendum)
MIGRAINE PREVENTION  LIFESTYLE CHANGES -Stop or avoid smoking -Decrease or avoid caffeine / alcohol -Eat and sleep on a regular schedule -Exercise several times per week - start topiramate 50mg  at bedtime; after 1-2 weeks increase to 50mg  twice a day; drink plenty of water  MIGRAINE RESCUE  - ibuprofen, tylenol as needed - start rizatriptan (Maxalt) 10mg  as needed for breakthrough headache; may repeat x 1 after 2 hours; max 2 tabs per day or 8 per month - consider rimegepant (Nurtec) 75mg  as needed for breakthrough headache; max 8 per month

## 2020-01-03 NOTE — Progress Notes (Signed)
[facility]  PATIENT: Sheri Lopez DOB: 1956/01/13  REFERRING CLINICIAN: Carolee Rota, NP HISTORY FROM: patient and husband  REASON FOR VISIT: new consult    HISTORICAL  CHIEF COMPLAINT:  Chief Complaint  Patient presents with  . Headache    rm 6 New Pt husband- Marden Noble "have been dx with migraines-getting worse x 3-4 yrs; tinnitus -eval by ENT/MRI; vision issues since May-seeing retinal specialist tomorrow"  . Dizziness    HISTORY OF PRESENT ILLNESS:   64 year old female here for evaluation of headaches.  History of retinitis pigmentosa, hypertension, hyperlipidemia, thyroid disease, psoriasis, breast cancer.  Patient had headaches with migraine features in the 1990s.  These went away spontaneously.  Since 2020 has recurrence of similar but worsening headaches recently with right eye, right temporal, right posterior auricular pain, throbbing sensation, nausea, sensitivity to light and sound, dizziness.  She feels her heartbeat and throbbing sensation.  Triggers include allergies and fluorescent lights.  Headaches can last up to 3 days at a time.  Patient averaging up to 15 days of headache per month.  Sometimes she sees white spots and sparkles. Had MRI brain which was unremarkable in July 2020 for these symptoms.   Patient does have retinitis pigmentosa and has had multiple eye procedures and surgeries.  Patient has tried Excedrin Migraine with mild relief.  Family history of migraine in her daughter.    REVIEW OF SYSTEMS: Full 14 system review of systems performed and negative with exception of: As per HPI.  ALLERGIES: Allergies  Allergen Reactions  . Beta Adrenergic Blockers     Lethargy, severe headaches  . Keflex [Cephalexin] Hives    HOME MEDICATIONS: Outpatient Medications Prior to Visit  Medication Sig Dispense Refill  . albuterol (PROVENTIL HFA;VENTOLIN HFA) 108 (90 Base) MCG/ACT inhaler Inhale 2 puffs into the lungs every 4 (four)  hours as needed for shortness of breath. 1 Inhaler 2  . AYR SALINE NASAL NA Nasal wash    . cholecalciferol (VITAMIN D3) 25 MCG (1000 UNIT) tablet Take 2,000 Units by mouth daily.    . clobetasol cream (TEMOVATE) 7.62 % Apply 1 application topically as directed.    . Cyanocobalamin (B-12 PO) Take 2 capsules by mouth daily.    . famotidine (PEPCID) 20 MG tablet Take 20 mg by mouth 2 (two) times daily.    . fexofenadine (ALLEGRA) 180 MG tablet Take 180 mg by mouth daily.    . flecainide (TAMBOCOR) 50 MG tablet Take 25 mg by mouth 2 (two) times daily.    . fluticasone (FLONASE) 50 MCG/ACT nasal spray Place 1 spray into both nostrils daily.    Marland Kitchen ibuprofen (ADVIL,MOTRIN) 200 MG tablet Take 600 mg by mouth every 6 (six) hours as needed (tooth pain).    Marland Kitchen levothyroxine (SYNTHROID, LEVOTHROID) 75 MCG tablet Take 75 mcg by mouth daily before breakfast.    . meclizine (ANTIVERT) 25 MG tablet Take 25 mg by mouth 2 (two) times daily as needed.    . propranolol (INDERAL) 10 MG tablet Take 1 tablet (10 mg total) by mouth 3 (three) times daily as needed. For palpitations/heart rates greater than 100 bpm. 30 tablet 0  . sertraline (ZOLOFT) 50 MG tablet Take 50 mg by mouth daily.    . valsartan-hydrochlorothiazide (DIOVAN-HCT) 320-25 MG tablet Take 1 tablet by mouth daily. 90 tablet 3   No facility-administered medications prior to visit.    PAST MEDICAL HISTORY: Past Medical History:  Diagnosis Date  . Allergic rhinitis   .  Anxiety   . Atrial tachycardia ([place]) 01/27/2017  . Atypical nevus 03/27/2016   Left Mid Back-Moderate to Severe(w/s widershave)  . Atypical nevus 08/31/2017   Right Wrist-Atypical Proliferation ([place])  . Atypical nevus 04/12/2018   Right Upperarm-Atypical Proliferation ([place])  . Bradycardia   . Cancer ([facility])    breast  . Dizziness   . Essential hypertension 12/08/2016  . Fatigue 12/08/2016  . Frozen shoulder   . Headache   . Heart murmur   . Hyperlipidemia 12/08/2016   . Hypertension   . Lower extremity edema 12/08/2016  . Palpitations 12/08/2016  . Psoriasis   . Retinitis pigmentosa   . Shortness of breath 12/08/2016  . Thyroid disease   . Tinnitus   . Venous insufficiency     PAST SURGICAL HISTORY: Past Surgical History:  Procedure Laterality Date  . ABLATION ON ENDOMETRIOSIS    . BREAST EXCISIONAL BIOPSY Right    fibrocystic cysts  . BREAST EXCISIONAL BIOPSY Left 2013  . cataracts    . CESAREAN SECTION     x 1  . eye surgeries    . moles excised     x 2  . NASAL SINUS SURGERY      FAMILY HISTORY: Family History  Problem Relation Age of Onset  . Breast cancer Maternal Aunt   . Cancer Mother   . Hyperlipidemia Mother   . Hypertension Mother   . Arrhythmia Mother   . Cancer Father   . CAD Father   . Heart attack Maternal Grandfather   . Heart failure Paternal Grandmother   . Heart attack Paternal Grandfather   . Hypertension Sister   . Hypertension Brother     SOCIAL HISTORY: Social History   Socioeconomic History  . Marital status: Married    Spouse name: Marden Noble  . Number of children: 3  . Years of education: Not on file  . Highest education level: Bachelor's degree (e.g., BA, AB, BS)  Occupational History  . Not on file  Tobacco Use  . Smoking status: Never Smoker  . Smokeless tobacco: Never Used  Substance and Sexual Activity  . Alcohol use: Not Currently  . Drug use: Never  . Sexual activity: Not on file  Other Topics Concern  . Not on file  Social History Narrative   Lives with husband   Social Determinants of Health   Financial Resource Strain:   . Difficulty of Paying Living Expenses:   Food Insecurity:   . Worried About Charity fundraiser in the Last Year:   . Arboriculturist in the Last Year:   Transportation Needs:   . Film/video editor (Medical):   Marland Kitchen Lack of Transportation (Non-Medical):   Physical Activity:   . Days of Exercise per Week:   . Minutes of Exercise per Session:   Stress:    . Feeling of Stress :   Social Connections:   . Frequency of Communication with Friends and Family:   . Frequency of Social Gatherings with Friends and Family:   . Attends Religious Services:   . Active Member of Clubs or Organizations:   . Attends Archivist Meetings:   Marland Kitchen Marital Status:   Intimate Partner Violence:   . Fear of Current or Ex-Partner:   . Emotionally Abused:   Marland Kitchen Physically Abused:   . Sexually Abused:      PHYSICAL EXAM  GENERAL EXAM/CONSTITUTIONAL: Vitals:  Vitals:   01/03/20 0839  BP: 115/78  Pulse: 66  Weight:  194 lb 9.6 oz (88.3 kg)  Height: 5\' 7"  (1.702 m)     Body mass index is 30.48 kg/m. Wt Readings from Last 3 Encounters:  01/03/20 194 lb 9.6 oz (88.3 kg)  11/13/19 194 lb 9.6 oz (88.3 kg)  11/07/19 192 lb (87.1 kg)     Patient is in no distress; well developed, nourished and groomed; neck is supple  CARDIOVASCULAR:  Examination of carotid arteries is normal; no carotid bruits  Regular rate and rhythm, no murmurs  Examination of peripheral vascular system by observation and palpation is normal  EYES:  Ophthalmoscopic exam --> POST SURGICAL BILATERALLY  No exam data present  MUSCULOSKELETAL:  Gait, strength, tone, movements noted in Neurologic exam below  NEUROLOGIC: MENTAL STATUS:  No flowsheet data found.  awake, alert, oriented to person, place and time  recent and remote memory intact  normal attention and concentration  language fluent, comprehension intact, naming intact  fund of knowledge appropriate  CRANIAL NERVE:   2nd - [place]; SOME LIGHT PERCEPTION PERIPHERALLY  2nd, 3rd, 4th, 6th - pupils NOT REACTIVE; LEFT PUPIL IRREGULAR; POST-SURGICAL BILATERALLY; extraocular muscles intact, no nystagmus  5th - facial sensation symmetric  7th - facial strength symmetric  8th - hearing intact  9th - palate elevates symmetrically, uvula midline  11th - shoulder shrug  symmetric  12th - tongue protrusion midline  MOTOR:   normal bulk and tone, full strength in the BUE, BLE  SENSORY:   normal and symmetric to light touch, temperature, vibration  COORDINATION:   finger-nose-finger, fine finger movements normal  REFLEXES:   deep tendon reflexes TRACE and symmetric  GAIT/STATION:   narrow based gait     DIAGNOSTIC DATA (LABS, IMAGING, TESTING) - I reviewed patient records, labs, notes, testing and imaging myself where available.  No results found for: WBC, HGB, HCT, MCV, PLT    Component Value Date/Time   NA 140 09/15/2019 1027   K 4.6 09/15/2019 1027   CL 99 09/15/2019 1027   CO2 25 09/15/2019 1027   GLUCOSE 87 09/15/2019 1027   BUN 19 09/15/2019 1027   CREATININE 0.91 09/15/2019 1027   CALCIUM 9.5 09/15/2019 1027   GFRNONAA 67 09/15/2019 1027   GFRAA 77 09/15/2019 1027   No results found for: CHOL, HDL, LDLCALC, LDLDIRECT, TRIG, CHOLHDL No results found for: HGBA1C No results found for: VITAMINB12 No results found for: TSH   12/29/18 MRI brain / IAC [I reviewed images myself and agree with interpretation. -[facility]]  1. Negative internal auditory imaging. 2. Evidence of chronic small vessel ischemia in the right anterior corona radiata, but otherwise normal for age MRI appearance of the brain. 3. Mild to moderate maxillary sinus disease.    ASSESSMENT AND PLAN  64 y.o. year old female here with migraine with aura.  Dx:  1. Migraine with aura and without status migrainosus, not intractable      PLAN:   MIGRAINE WITH AURA TREATMENT PLAN:  MIGRAINE PREVENTION  LIFESTYLE CHANGES -Stop or avoid smoking -Decrease or avoid caffeine / alcohol -Eat and sleep on a regular schedule -Exercise several times per week - start topiramate 50mg  at bedtime; after 1-2 weeks increase to 50mg  twice a day; drink plenty of water  Consider 2nd line - fremanezumab (Ajovy) 225mg  monthly (or 675mg  every 3 months) - galazanezumab  (Emgality) 240mg  loading dose; then 120mg  monthly   MIGRAINE RESCUE  - ibuprofen, tylenol as needed - start rizatriptan (Maxalt) 10mg  as needed for breakthrough headache;  may repeat x 1 after 2 hours; max 2 tabs per day or 8 per month - consider rimegepant (Nurtec) 75mg  as needed for breakthrough headache; max 8 per month  Meds ordered this encounter  Medications  . topiramate (TOPAMAX) 50 MG tablet    Sig: Take 1 tablet (50 mg total) by mouth 2 (two) times daily.    Dispense:  60 tablet    Refill:  12  . rizatriptan (MAXALT-MLT) 10 MG disintegrating tablet    Sig: Take 1 tablet (10 mg total) by mouth as needed for migraine. May repeat in 2 hours if needed    Dispense:  9 tablet    Refill:  11   Return in about 8 months (around 09/02/2020) for with NP (Amy Lomax).    Penni Bombard, MD 06/04/2393, 3:20 AM Certified in Neurology, Neurophysiology and Neuroimaging  [facility] [address], [place], [place] [address] [phone]

## 2020-01-04 DIAGNOSIS — H59022 Cataract (lens) fragments in eye following cataract surgery, left eye: Secondary | ICD-10-CM | POA: Diagnosis not present

## 2020-01-04 DIAGNOSIS — H2181 Floppy iris syndrome: Secondary | ICD-10-CM | POA: Diagnosis not present

## 2020-01-04 DIAGNOSIS — H26412 Soemmering's ring, left eye: Secondary | ICD-10-CM | POA: Diagnosis not present

## 2020-01-04 DIAGNOSIS — H3552 Pigmentary retinal dystrophy: Secondary | ICD-10-CM | POA: Diagnosis not present

## 2020-01-10 ENCOUNTER — Other Ambulatory Visit: Payer: Self-pay

## 2020-01-10 ENCOUNTER — Ambulatory Visit (HOSPITAL_COMMUNITY)
Admission: RE | Admit: 2020-01-10 | Discharge: 2020-01-10 | Disposition: A | Discharge status: Home / Self Care | Payer: Medicare Other | Source: Ambulatory Visit | Attending: Cardiology | Admitting: Cardiology

## 2020-01-10 DIAGNOSIS — I739 Peripheral vascular disease, unspecified: Secondary | ICD-10-CM | POA: Insufficient documentation

## 2020-01-16 ENCOUNTER — Ambulatory Visit: Payer: Medicare Other | Admitting: Cardiovascular Disease

## 2020-01-16 ENCOUNTER — Encounter: Payer: Self-pay | Admitting: Cardiovascular Disease

## 2020-01-16 ENCOUNTER — Other Ambulatory Visit: Payer: Self-pay

## 2020-01-16 VITALS — BP 110/72 | HR 56 | Ht 67.0 in | Wt 192.8 lb

## 2020-01-16 DIAGNOSIS — E785 Hyperlipidemia, unspecified: Secondary | ICD-10-CM

## 2020-01-16 DIAGNOSIS — I739 Peripheral vascular disease, unspecified: Secondary | ICD-10-CM

## 2020-01-16 DIAGNOSIS — I10 Essential (primary) hypertension: Secondary | ICD-10-CM | POA: Diagnosis not present

## 2020-01-16 MED ORDER — ASPIRIN EC 81 MG PO TBEC: 81.0000 mg | DELAYED_RELEASE_TABLET | Freq: Every day | ORAL | 3 refills | Status: AC

## 2020-01-16 MED ORDER — ROSUVASTATIN CALCIUM 10 MG PO TABS: 10.0000 mg | ORAL_TABLET | Freq: Every day | ORAL | 3 refills | Status: DC

## 2020-01-16 NOTE — Patient Instructions (Signed)
Medication Instructions:  START Crestor 10mg  (1 tablet) daily START Aspirin 81mg  Daily *If you need a refill on your cardiac medications before your next appointment, please call your pharmacy*   Lab Work: Blood work: Lipid and Hepatic Function in 2 months- this needs to be fasting.  If you have labs (blood work) drawn today and your tests are completely normal, you will receive your results only by: Marland Kitchen MyChart Message (if you have MyChart) OR . A paper copy in the mail If you have any lab test that is abnormal or we need to change your treatment, we will call you to review the results.   Testing/Procedures: None ordered at this time   Follow-Up: At [facility], you and your health needs are our priority.  As part of our continuing mission to provide you with exceptional heart care, we have created designated Provider Care Teams.  These Care Teams include your primary Cardiologist (physician) and Advanced Practice Providers (APPs -  Physician Assistants and Nurse Practitioners) who all work together to provide you with the care you need, when you need it.  We recommend signing up for the patient portal called "MyChart".  Sign up information is provided on this After Visit Summary.  MyChart is used to connect with patients for Virtual Visits (Telemedicine).  Patients are able to view lab/test results, encounter notes, upcoming appointments, etc.  Non-urgent messages can be sent to your provider as well.   To learn more about what you can do with MyChart, go to [email or web address].    Your next appointment:   6 month(s)  The format for your next appointment:   In Person  Provider:   Kathlyn [place], MD   Other Instructions

## 2020-01-16 NOTE — Progress Notes (Signed)
Cardiology Office Note   Date:  01/16/2020   ID:  Sheri Lopez, DOB 1955/07/22, MRN [id number]  PCP:  Carolee Rota, NP  Cardiologist: Dr. [name]  No chief complaint on file.     History of Present Illness: Sheri Lopez is a 64 y.o. female who was referred by Bunnie Domino for evaluation management of peripheral arterial disease.  She has known history of essential hypertension, PVCs, migraine, anxiety, bradycardia, legal blindness and breast cancer.  Previous cardiac work-up revealed normal LV systolic function.  She was recently found to have abdominal bruits.  Thus, she underwent noninvasive vascular studies.  ABI was normal bilaterally with relatively normal toe pressure.  Duplex showed no evidence of abdominal aortic aneurysm.  There was moderate bilateral external iliac artery disease with peak velocity of 271 on the left and 263 on the right.  She reports mild discomfort in both legs mostly related to varicose veins. She has pain with crossing her legs. When she first gets up to walk, her legs feel stiff and heavy but they improve as she walks further. She is able to walk 2 or 3 miles without having to stop. She has no lower extremity ulceration.  Past Medical History:  Diagnosis Date  . Allergic rhinitis   . Anxiety   . Atrial tachycardia ([place]) 01/27/2017  . Atypical nevus 03/27/2016   Left Mid Back-Moderate to Severe(w/s widershave)  . Atypical nevus 08/31/2017   Right Wrist-Atypical Proliferation ([place])  . Atypical nevus 04/12/2018   Right Upperarm-Atypical Proliferation ([place])  . Bradycardia   . Cancer (HCC)    breast  . Dizziness   . Essential hypertension 12/08/2016  . Fatigue 12/08/2016  . Frozen shoulder   . Headache   . Heart murmur   . Hyperlipidemia 12/08/2016  . Hypertension   . Lower extremity edema 12/08/2016  . Palpitations 12/08/2016  . Psoriasis   . Retinitis pigmentosa   . Shortness of breath 12/08/2016  . Thyroid disease    . Tinnitus   . Venous insufficiency     Past Surgical History:  Procedure Laterality Date  . ABLATION ON ENDOMETRIOSIS    . BREAST EXCISIONAL BIOPSY Right    fibrocystic cysts  . BREAST EXCISIONAL BIOPSY Left 2013  . cataracts    . CESAREAN SECTION     x 1  . eye surgeries    . moles excised     x 2  . NASAL SINUS SURGERY       Current Outpatient Medications  Medication Sig Dispense Refill  . albuterol (PROVENTIL HFA;VENTOLIN HFA) 108 (90 Base) MCG/ACT inhaler Inhale 2 puffs into the lungs every 4 (four) hours as needed for shortness of breath. 1 Inhaler 2  . AYR SALINE NASAL NA Nasal wash    . cholecalciferol (VITAMIN D3) 25 MCG (1000 UNIT) tablet Take 2,000 Units by mouth daily.    . clobetasol cream (TEMOVATE) 2.72 % Apply 1 application topically as directed.    . Cyanocobalamin (B-12 PO) Take 2 capsules by mouth daily.    . famotidine (PEPCID) 20 MG tablet Take 20 mg by mouth 2 (two) times daily.    . fexofenadine (ALLEGRA) 180 MG tablet Take 180 mg by mouth daily.    . flecainide (TAMBOCOR) 50 MG tablet Take 25 mg by mouth 2 (two) times daily.    . fluticasone (FLONASE) 50 MCG/ACT nasal spray Place 1 spray into both nostrils daily.    Marland Kitchen ibuprofen (ADVIL,MOTRIN) 200 MG tablet Take 600  mg by mouth every 6 (six) hours as needed (tooth pain).    Marland Kitchen levothyroxine (SYNTHROID, LEVOTHROID) 75 MCG tablet Take 75 mcg by mouth daily before breakfast.    . meclizine (ANTIVERT) 25 MG tablet Take 25 mg by mouth 2 (two) times daily as needed.    . propranolol (INDERAL) 10 MG tablet Take 1 tablet (10 mg total) by mouth 3 (three) times daily as needed. For palpitations/heart rates greater than 100 bpm. 30 tablet 0  . rizatriptan (MAXALT-MLT) 10 MG disintegrating tablet Take 1 tablet (10 mg total) by mouth as needed for migraine. May repeat in 2 hours if needed 9 tablet 11  . sertraline (ZOLOFT) 50 MG tablet Take 50 mg by mouth daily.    Marland Kitchen topiramate (TOPAMAX) 50 MG tablet Take 1 tablet (50  mg total) by mouth 2 (two) times daily. 60 tablet 12  . valsartan-hydrochlorothiazide (DIOVAN-HCT) 320-25 MG tablet Take 1 tablet by mouth daily. 90 tablet 3   No current facility-administered medications for this visit.    Allergies:   Beta adrenergic blockers and Keflex [cephalexin]    Social History:  The patient  reports that she has never smoked. She has never used smokeless tobacco. She reports previous alcohol use. She reports that she does not use drugs.   Family History:  [name] history includes Arrhythmia in her mother; Breast cancer in her maternal aunt; CAD in her father; Cancer in her father and mother; Heart attack in her maternal grandfather and paternal grandfather; Heart failure in her paternal grandmother; Hyperlipidemia in her mother; Hypertension in her brother, mother, and sister.    ROS:  Please see the history of present illness.   Otherwise, review of systems are positive for none.   All other systems are reviewed and negative.    PHYSICAL EXAM: VS:  BP 110/72   Pulse (!) 56   Ht 5\' 7"  (1.702 m)   Wt 192 lb 12.8 oz (87.5 kg)   SpO2 98%   BMI 30.20 kg/m  , BMI Body mass index is 30.2 kg/m. GEN: Well nourished, well developed, in no acute distress  HEENT: normal  Neck: no JVD, carotid bruits, or masses Cardiac: RRR; no murmurs, rubs, or gallops, mild bilateral leg edema  Respiratory:  clear to auscultation bilaterally, normal work of breathing GI: soft, nontender, nondistended, + BS MS: no deformity or atrophy  Skin: warm and dry, no rash Neuro:  Strength and sensation are intact Psych: euthymic mood, full affect Vascular: Femoral pulses slightly diminished bilaterally with bilateral bruits. The bruit is louder on the left side. Distally, there is mild bilateral leg edema and just palpating the pulses are somewhat difficult but she does have +1 pulses distally. She has prominent varicose veins.   EKG:  EKG is not ordered today.    Recent  Labs: 09/15/2019: BUN 19; Creatinine, Ser 0.91; Potassium 4.6; Sodium 140    Lipid Panel No results found for: CHOL, TRIG, HDL, CHOLHDL, VLDL, LDLCALC, LDLDIRECT    Wt Readings from Last 3 Encounters:  01/16/20 192 lb 12.8 oz (87.5 kg)  01/03/20 194 lb 9.6 oz (88.3 kg)  11/13/19 194 lb 9.6 oz (88.3 kg)       No flowsheet data found.    ASSESSMENT AND PLAN:  1. Peripheral arterial disease: The patient has evidence of moderate bilateral external iliac artery disease and she does have bilateral bruits. In spite of that, her ABI continues to be normal and she has no significant claudication  or lower extremity ulceration. Due to that, I recommend medical therapy and continued walking program. I elected to add aspirin 81 mg once daily. In addition, she needs to be treated with a statin.  2. Hyperlipidemia: Given recent diagnosis of peripheral arterial disease, I added rosuvastatin 10 mg daily regardless of what her lipid profile is. Check lipid and liver profile in 2 months. I discussed side effects of medications.  3. Essential hypertension: Blood pressures controlled on current medications.    Disposition:   FU with me in 6 months  Signed,  Kathlyn Sacramento, MD  01/16/2020 9:22 AM    [place]

## 2020-01-29 DIAGNOSIS — J3081 Allergic rhinitis due to animal (cat) (dog) hair and dander: Secondary | ICD-10-CM | POA: Diagnosis not present

## 2020-01-29 DIAGNOSIS — J301 Allergic rhinitis due to pollen: Secondary | ICD-10-CM | POA: Diagnosis not present

## 2020-01-29 DIAGNOSIS — J3089 Other allergic rhinitis: Secondary | ICD-10-CM | POA: Diagnosis not present

## 2020-01-29 DIAGNOSIS — H1045 Other chronic allergic conjunctivitis: Secondary | ICD-10-CM | POA: Diagnosis not present

## 2020-02-15 ENCOUNTER — Telehealth: Payer: Self-pay | Admitting: Diagnostic Neuroimaging

## 2020-02-15 NOTE — Telephone Encounter (Signed)
Pt called wanting to discuss the bad heartburn she is getting on her topiramate (TOPAMAX) 50 MG tablet with RN. Please advise.

## 2020-02-15 NOTE — Telephone Encounter (Signed)
Spoke with patient who stated she began taking Topiramate once daily but increased it before Labor Day. She developed what felt like sores in her throat so she cut back to once a day on Labor day. I advised she stop Topiramate, let side effects resolve . I requested she call back when better to discuss other medications. I advised will let Dr Leta Baptist know an call her only if he has further instructions.  She verbalized understanding, appreciation.'

## 2020-02-21 DIAGNOSIS — H59022 Cataract (lens) fragments in eye following cataract surgery, left eye: Secondary | ICD-10-CM | POA: Diagnosis not present

## 2020-02-26 DIAGNOSIS — N183 Chronic kidney disease, stage 3 unspecified: Secondary | ICD-10-CM | POA: Diagnosis not present

## 2020-02-26 DIAGNOSIS — I10 Essential (primary) hypertension: Secondary | ICD-10-CM | POA: Diagnosis not present

## 2020-02-26 DIAGNOSIS — J4520 Mild intermittent asthma, uncomplicated: Secondary | ICD-10-CM | POA: Diagnosis not present

## 2020-02-26 DIAGNOSIS — E039 Hypothyroidism, unspecified: Secondary | ICD-10-CM | POA: Diagnosis not present

## 2020-02-27 ENCOUNTER — Encounter: Payer: Self-pay | Admitting: *Deleted

## 2020-02-28 ENCOUNTER — Telehealth: Payer: Self-pay | Admitting: Diagnostic Neuroimaging

## 2020-02-28 ENCOUNTER — Encounter: Payer: Self-pay | Admitting: *Deleted

## 2020-02-28 MED ORDER — AIMOVIG 70 MG/ML ~~LOC~~ SOAJ: 70.0000 mg | SUBCUTANEOUS | 4 refills | Status: DC

## 2020-02-28 NOTE — Telephone Encounter (Signed)
[employer] PA, key: [id number], [id number], failed topiramate.  Your information has been submitted to [place]. [employer] [place] will review the request and notify you of the determination decision directly, typically within 3 business days of your submission and once all necessary information is received.  If [employer] [place] has not responded within the specified timeframe or if you have any questions about your PA submission, contact [place]  directly at [facility]) [phone] or ([place]) [phone].

## 2020-02-28 NOTE — Addendum Note (Signed)
Addended by: Minna Antis on: 02/28/2020 04:54 PM   Modules accepted: Orders

## 2020-02-28 NOTE — Telephone Encounter (Signed)
[employer] approved aimovig Effective from 02/28/2020 through 02/27/2021. Notified patient via my chart.

## 2020-02-28 NOTE — Telephone Encounter (Signed)
Meds ordered this encounter  Medications  . Erenumab-aooe (AIMOVIG) 70 MG/ML SOAJ    Sig: Inject 70 mg into the skin every 30 (thirty) days.    Dispense:  3 mL    Refill:  Hominy, MD 8/33/5825, 1:89 PM Certified in Neurology, Neurophysiology and Neuroimaging  [facility] [address], [place], [place] [address] [phone]

## 2020-02-29 NOTE — Telephone Encounter (Signed)
Sheri Lopez @ [facility] called to inform of the approval for 1 yr from yesterday's date. If there are questions they can be reached at [phone] option 5

## 2020-03-06 ENCOUNTER — Other Ambulatory Visit: Payer: Self-pay | Admitting: Nurse Practitioner

## 2020-03-06 DIAGNOSIS — Z1231 Encounter for screening mammogram for malignant neoplasm of breast: Secondary | ICD-10-CM

## 2020-03-11 MED ORDER — FLECAINIDE ACETATE 50 MG PO TABS: 25.0000 mg | ORAL_TABLET | Freq: Two times a day (BID) | ORAL | 2 refills | Status: DC

## 2020-03-19 DIAGNOSIS — I10 Essential (primary) hypertension: Secondary | ICD-10-CM | POA: Diagnosis not present

## 2020-03-19 DIAGNOSIS — E785 Hyperlipidemia, unspecified: Secondary | ICD-10-CM | POA: Diagnosis not present

## 2020-03-19 LAB — LIPID PANEL
Chol/HDL Ratio: 2.4 ratio (ref 0.0–4.4)
Cholesterol, Total: 153 mg/dL (ref 100–199)
HDL: 65 mg/dL (ref >39)
LDL Chol Calc (NIH): 70 mg/dL (ref 0–99)
Triglycerides: 99 mg/dL (ref 0–149)
VLDL Cholesterol Cal: 18 mg/dL (ref 5–40)

## 2020-03-19 LAB — HEPATIC FUNCTION PANEL
ALT: 14 IU/L (ref 0–32)
AST: 19 IU/L (ref 0–40)
Albumin: 4.4 g/dL (ref 3.8–4.8)
Alkaline Phosphatase: 55 IU/L (ref 44–121)
Bilirubin Total: 0.4 mg/dL (ref 0.0–1.2)
Bilirubin, Direct: 0.13 mg/dL (ref 0.00–0.40)
Total Protein: 6.7 g/dL (ref 6.0–8.5)

## 2020-03-26 ENCOUNTER — Ambulatory Visit: Payer: Medicare Other

## 2020-03-31 DIAGNOSIS — E039 Hypothyroidism, unspecified: Secondary | ICD-10-CM | POA: Diagnosis not present

## 2020-03-31 DIAGNOSIS — I10 Essential (primary) hypertension: Secondary | ICD-10-CM | POA: Diagnosis not present

## 2020-03-31 DIAGNOSIS — N183 Chronic kidney disease, stage 3 unspecified: Secondary | ICD-10-CM | POA: Diagnosis not present

## 2020-03-31 DIAGNOSIS — J4520 Mild intermittent asthma, uncomplicated: Secondary | ICD-10-CM | POA: Diagnosis not present

## 2020-04-29 ENCOUNTER — Other Ambulatory Visit: Payer: Self-pay

## 2020-04-29 ENCOUNTER — Ambulatory Visit
Admission: RE | Admit: 2020-04-29 | Discharge: 2020-04-29 | Disposition: A | Discharge status: Home / Self Care | Payer: Medicare Other | Source: Ambulatory Visit | Attending: Nurse Practitioner | Admitting: Nurse Practitioner

## 2020-04-29 DIAGNOSIS — Z1231 Encounter for screening mammogram for malignant neoplasm of breast: Secondary | ICD-10-CM | POA: Diagnosis not present

## 2020-05-01 DIAGNOSIS — H3552 Pigmentary retinal dystrophy: Secondary | ICD-10-CM | POA: Diagnosis not present

## 2020-05-07 ENCOUNTER — Ambulatory Visit: Payer: Medicare Other | Admitting: Cardiology

## 2020-05-07 ENCOUNTER — Encounter: Payer: Self-pay | Admitting: Cardiology

## 2020-05-07 ENCOUNTER — Other Ambulatory Visit: Payer: Self-pay

## 2020-05-07 VITALS — BP 122/88 | HR 46 | Ht 67.0 in | Wt 198.0 lb

## 2020-05-07 DIAGNOSIS — I471 Supraventricular tachycardia: Secondary | ICD-10-CM

## 2020-05-07 MED ORDER — FLECAINIDE ACETATE 50 MG PO TABS: 50.0000 mg | ORAL_TABLET | Freq: Two times a day (BID) | ORAL | 2 refills | Status: DC

## 2020-05-07 NOTE — Progress Notes (Signed)
Electrophysiology Office Note   Date:  05/07/2020   ID:  Sheri Lopez, DOB [birth year], MRN [id number]  PCP:  Carolee Rota, NP  Cardiologist:  [name] Primary Electrophysiologist:  Dalma Panchal [name], MD    No chief complaint on file.    History of Present Illness: Sheri Lopez is a 64 y.o. female who is being seen today for the evaluation of SVT at the request of Carolee Rota, NP. Presenting today for electrophysiology evaluation.   She has a history significant for hypertension, bradycardia, legal blindness, and breast cancer.  She had an echo that showed normal systolic function and a Myoview with normal function no ischemia.  She began having palpitations and wore a 14-day monitor that showed PACs, PVCs, and atrial tachycardia.  Heart rate was 62 at the time.  She was put on flecainide.  Today, denies symptoms of palpitations, chest pain, shortness of breath, orthopnea, PND, lower extremity edema, claudication, dizziness, presyncope, syncope, bleeding, or neurologic sequela. The patient is tolerating medications without difficulties.  Since last being seen she has done well.  She did have an episode of palpitations and was restarted on her flecainide.  Since starting her flecainide she has noted no further palpitations and has no acute complaints.  Past Medical History:  Diagnosis Date  . Allergic rhinitis   . Anxiety   . Atrial tachycardia ([place]) 01/27/2017  . Atypical nevus 03/27/2016   Left Mid Back-Moderate to Severe(w/s widershave)  . Atypical nevus 08/31/2017   Right Wrist-Atypical Proliferation ([place])  . Atypical nevus 04/12/2018   Right Upperarm-Atypical Proliferation ([place])  . Bradycardia   . Cancer (HCC)    breast  . Dizziness   . Essential hypertension 12/08/2016  . Fatigue 12/08/2016  . Frozen shoulder   . Headache   . Heart murmur   . Hyperlipidemia 12/08/2016  . Hypertension   . Lower extremity edema 12/08/2016  . Palpitations  12/08/2016  . Psoriasis   . Retinitis pigmentosa   . Shortness of breath 12/08/2016  . Thyroid disease   . Tinnitus   . Venous insufficiency    Past Surgical History:  Procedure Laterality Date  . ABLATION ON ENDOMETRIOSIS    . BREAST EXCISIONAL BIOPSY Right    fibrocystic cysts  . BREAST EXCISIONAL BIOPSY Left 2013  . cataracts    . CESAREAN SECTION     x 1  . eye surgeries    . moles excised     x 2  . NASAL SINUS SURGERY       Current Outpatient Medications  Medication Sig Dispense Refill  . albuterol (PROVENTIL HFA;VENTOLIN HFA) 108 (90 Base) MCG/ACT inhaler Inhale 2 puffs into the lungs every 4 (four) hours as needed for shortness of breath. 1 Inhaler 2  . aspirin EC 81 MG tablet Take 1 tablet (81 mg total) by mouth daily. Swallow whole. 90 tablet 3  . AYR SALINE NASAL NA Nasal wash    . cholecalciferol (VITAMIN D3) 25 MCG (1000 UNIT) tablet Take 2,000 Units by mouth daily.    . clobetasol cream (TEMOVATE) 5.00 % Apply 1 application topically as directed.    . Cyanocobalamin (B-12 PO) Take 2 capsules by mouth daily.    Eduard Roux (AIMOVIG) 70 MG/ML SOAJ Inject 70 mg into the skin every 30 (thirty) days. 3 mL 4  . famotidine (PEPCID) 20 MG tablet Take 20 mg by mouth 2 (two) times daily.    . fexofenadine (ALLEGRA) 180 MG tablet Take 180 mg by  mouth daily.    . flecainide (TAMBOCOR) 50 MG tablet Take 1 tablet (50 mg total) by mouth 2 (two) times daily. 90 tablet 2  . ibuprofen (ADVIL,MOTRIN) 200 MG tablet Take 600 mg by mouth every 6 (six) hours as needed (tooth pain).    Marland Kitchen levothyroxine (SYNTHROID, LEVOTHROID) 75 MCG tablet Take 75 mcg by mouth daily before breakfast.    . meclizine (ANTIVERT) 25 MG tablet Take 25 mg by mouth 2 (two) times daily as needed.    . propranolol (INDERAL) 10 MG tablet Take 1 tablet (10 mg total) by mouth 3 (three) times daily as needed. For palpitations/heart rates greater than 100 bpm. 30 tablet 0  . rizatriptan (MAXALT-MLT) 10 MG  disintegrating tablet Take 1 tablet (10 mg total) by mouth as needed for migraine. May repeat in 2 hours if needed 9 tablet 11  . rosuvastatin (CRESTOR) 10 MG tablet Take 1 tablet (10 mg total) by mouth daily. 90 tablet 3  . sertraline (ZOLOFT) 50 MG tablet Take 50 mg by mouth daily.    . valsartan-hydrochlorothiazide (DIOVAN-HCT) 320-25 MG tablet Take 1 tablet by mouth daily. 90 tablet 3   No current facility-administered medications for this visit.    Allergies:   Beta adrenergic blockers, Topamax [topiramate], and Keflex [cephalexin]   Social History:  The patient  reports that she has never smoked. She has never used smokeless tobacco. She reports previous alcohol use. She reports that she does not use drugs.   Family History:  [name] history includes Arrhythmia in her mother; Breast cancer in her maternal aunt; CAD in her father; Cancer in her father and mother; Heart attack in her maternal grandfather and paternal grandfather; Heart failure in her paternal grandmother; Hyperlipidemia in her mother; Hypertension in her brother, mother, and sister.    ROS:  Please see the history of present illness.   Otherwise, review of systems is positive for none.   All other systems are reviewed and negative.   PHYSICAL EXAM: VS:  BP 122/88   Pulse (!) 46   Ht 5\' 7"  (1.702 m)   Wt 198 lb (89.8 kg)   SpO2 99%   BMI 31.01 kg/m  , BMI Body mass index is 31.01 kg/m. GEN: Well nourished, well developed, in no acute distress  HEENT: normal  Neck: no JVD, carotid bruits, or masses Cardiac: RRR; no murmurs, rubs, or gallops,no edema  Respiratory:  clear to auscultation bilaterally, normal work of breathing GI: soft, nontender, nondistended, + BS MS: no deformity or atrophy  Skin: warm and dry Neuro:  Strength and sensation are intact Psych: euthymic mood, full affect  EKG:  EKG is ordered today. Personal review of the ekg ordered shows sinus rhythm, rate 46  Recent  Labs: 09/15/2019: BUN 19; Creatinine, Ser 0.91; Potassium 4.6; Sodium 140 03/19/2020: ALT 14    Lipid Panel     Component Value Date/Time   CHOL 153 03/19/2020 0827   TRIG 99 03/19/2020 0827   HDL 65 03/19/2020 0827   CHOLHDL 2.4 03/19/2020 0827   LDLCALC 70 03/19/2020 0827     Wt Readings from Last 3 Encounters:  05/07/20 198 lb (89.8 kg)  01/16/20 192 lb 12.8 oz (87.5 kg)  01/03/20 194 lb 9.6 oz (88.3 kg)      Other studies Reviewed: Additional studies/ records that were reviewed today include: Myoview 12/10/16  Review of the above records today demonstrates:   Normal perfusion No ischemia  Nuclear stress EF: 59%.  Low risk study  TTE 12/21/16 - Left ventricle: The cavity size was normal. Systolic function was   normal. The estimated ejection fraction was in the range of 55%   to 60%. Wall motion was normal; there were no regional wall   motion abnormalities. Left ventricular diastolic function   parameters were normal.  Cardiac monitor 01/26/17 - personally reviewed Predominant rhythm: sinus rhythm, sinus bradycardia Average heart rate: 62 bpm  Sinus rhythm and sinus bradycardia  PACs, PVCs Atrial tachycardia  ETT 02/17/17  Blood pressure demonstrated a normal response to exercise.  Nonspecific ST changes noted during exercise. Immediate recovery there was 33mm of horizontal ST segment depression in the inferolateral leads  Abnormal exercise stress test in recovery. The patient had no symptoms during the stress test.  Submaximal ETT with patient achieving only 74% maximum predicted heart rate. The patient achieved 9.1 mets.  Recommed pharmacolgic myocardial perfusion scan.  ASSESSMENT AND PLAN:  1.  Atrial tachycardia: Currently on propranolol and flecainide, high risk medication monitoring.  There was confusion with drug dosing and she is taking 50 mg.  She is feeling well with this dose and not having any issues.  No changes.    2.  Fatigue: Currently  on propranolol.  Symptoms have improved.  3.  Hyperlipidemia: Continue Crestor per primary care.  Current medicines are reviewed at length with the patient today.   The patient does not have concerns regarding her medicines.  The following changes were made today: None  Labs/ tests ordered today include:  Orders Placed This Encounter  Procedures  . EKG 12-Lead     Disposition:   FU with Zoriah Pulice 6 months  Signed, Emmory Solivan [name], MD  05/07/2020 8:46 AM     [facility] [address] [place] [address] [phone] (office) [phone] (fax)

## 2020-05-07 NOTE — Patient Instructions (Signed)
Medication Instructions:  °Your physician recommends that you continue on your current medications as directed. Please refer to the Current Medication list given to you today. ° °*If you need a refill on your cardiac medications before your next appointment, please call your pharmacy* ° ° °Lab Work: °None ordered ° ° °Testing/Procedures: °None ordered ° ° °Follow-Up: °At [facility], you and your health needs are our priority.  As part of our continuing mission to provide you with exceptional heart care, we have created designated Provider Care Teams.  These Care Teams include your primary Cardiologist (physician) and Advanced Practice Providers (APPs -  Physician Assistants and Nurse Practitioners) who all work together to provide you with the care you need, when you need it. ° °Your next appointment:   °6 month(s) ° °The format for your next appointment:   °In Person ° °Provider:   °Will Camnitz, MD ° ° ° °Thank you for choosing [facility]!! ° ° °Arneda Sappington, RN °[phone] °  °

## 2020-05-16 ENCOUNTER — Ambulatory Visit: Payer: Medicare Other | Admitting: Cardiovascular Disease

## 2020-05-29 DIAGNOSIS — E039 Hypothyroidism, unspecified: Secondary | ICD-10-CM | POA: Diagnosis not present

## 2020-05-29 DIAGNOSIS — J4520 Mild intermittent asthma, uncomplicated: Secondary | ICD-10-CM | POA: Diagnosis not present

## 2020-05-29 DIAGNOSIS — I10 Essential (primary) hypertension: Secondary | ICD-10-CM | POA: Diagnosis not present

## 2020-05-29 DIAGNOSIS — K219 Gastro-esophageal reflux disease without esophagitis: Secondary | ICD-10-CM | POA: Diagnosis not present

## 2020-06-10 DIAGNOSIS — I10 Essential (primary) hypertension: Secondary | ICD-10-CM | POA: Diagnosis not present

## 2020-06-10 DIAGNOSIS — N183 Chronic kidney disease, stage 3 unspecified: Secondary | ICD-10-CM | POA: Diagnosis not present

## 2020-06-10 DIAGNOSIS — E039 Hypothyroidism, unspecified: Secondary | ICD-10-CM | POA: Diagnosis not present

## 2020-06-10 DIAGNOSIS — J4520 Mild intermittent asthma, uncomplicated: Secondary | ICD-10-CM | POA: Diagnosis not present

## 2020-07-09 ENCOUNTER — Telehealth (INDEPENDENT_AMBULATORY_CARE_PROVIDER_SITE_OTHER): Payer: Medicare Other | Admitting: Cardiovascular Disease

## 2020-07-09 ENCOUNTER — Encounter: Payer: Self-pay | Admitting: Cardiovascular Disease

## 2020-07-09 VITALS — BP 109/74 | HR 68 | Ht 67.0 in | Wt 190.0 lb

## 2020-07-09 DIAGNOSIS — I739 Peripheral vascular disease, unspecified: Secondary | ICD-10-CM | POA: Diagnosis not present

## 2020-07-09 DIAGNOSIS — K219 Gastro-esophageal reflux disease without esophagitis: Secondary | ICD-10-CM | POA: Diagnosis not present

## 2020-07-09 DIAGNOSIS — I10 Essential (primary) hypertension: Secondary | ICD-10-CM

## 2020-07-09 DIAGNOSIS — E039 Hypothyroidism, unspecified: Secondary | ICD-10-CM | POA: Diagnosis not present

## 2020-07-09 DIAGNOSIS — J4520 Mild intermittent asthma, uncomplicated: Secondary | ICD-10-CM | POA: Diagnosis not present

## 2020-07-09 DIAGNOSIS — E785 Hyperlipidemia, unspecified: Secondary | ICD-10-CM

## 2020-07-09 NOTE — Patient Instructions (Signed)
Medication Instructions:  Continue current medications  *If you need a refill on your cardiac medications before your next appointment, please call your pharmacy*   Lab Work: None Ordered   Testing/Procedures: None Ordered   Follow-Up: At [facility], you and your health needs are our priority.  As part of our continuing mission to provide you with exceptional heart care, we have created designated Provider Care Teams.  These Care Teams include your primary Cardiologist (physician) and Advanced Practice Providers (APPs -  Physician Assistants and Nurse Practitioners) who all work together to provide you with the care you need, when you need it.  We recommend signing up for the patient portal called "MyChart".  Sign up information is provided on this After Visit Summary.  MyChart is used to connect with patients for Virtual Visits (Telemedicine).  Patients are able to view lab/test results, encounter notes, upcoming appointments, etc.  Non-urgent messages can be sent to your provider as well.   To learn more about what you can do with MyChart, go to [email or web address].    Your next appointment:   6 month(s)  The format for your next appointment:   In Person  Provider:   You may see Dr Fletcher Anon or one of the following Advanced Practice Providers on your designated Care Team:    Kerin Ransom, PA-C  [place], [place]  Coletta Memos, [place]

## 2020-07-09 NOTE — Progress Notes (Signed)
Virtual Visit via Video Note   This visit type was conducted due to national recommendations for restrictions regarding the COVID-19 Pandemic (e.g. social distancing) in an effort to limit this patient's exposure and mitigate transmission in our community.  Due to her co-morbid illnesses, this patient is at least at moderate risk for complications without adequate follow up.  This format is felt to be most appropriate for this patient at this time.  All issues noted in this document were discussed and addressed.  A limited physical exam was performed with this format.  Please refer to the patient's chart for her consent to telehealth for [facility].       Date:  07/09/2020   ID:  Sheri Lopez, DOB 27-Aug-1955, MRN [id number] The patient was identified using 2 identifiers.  Patient Location: Home Provider Location: Office/Clinic  PCP:  Carolee Rota, NP  Cardiologist:  No primary care provider on file.  Electrophysiologist:  Will [name], MD   Evaluation Performed:  Follow-Up Visit  Chief Complaint: Sore throat and sinus congestion  History of Present Illness:    Sheri Lopez is a 65 y.o. female who was seen today via video visit for follow-up regarding peripheral arterial disease.  She has known history of essential hypertension, PVCs, migraine, anxiety, bradycardia, legal blindness and breast cancer.  Previous cardiac work-up revealed normal LV systolic function.  She is followed for peripheral arterial disease with abdominal bruits.  Noninvasive vascular studies last year revealed normal ABI bilaterally.  Duplex showed no evidence of abdominal aortic aneurysm.  There was moderate bilateral external iliac artery disease with peak velocity of 271 on the left and 263 on the right. She did not have convincing symptoms of claudication and has been treated medically.  Rosuvastatin was added for hyperlipidemia and she has been tolerating the medication. She reports that  almost all her leg symptoms started after she started taking an injection for migraine called Aimovig.  The patient is currently at home recovering from Covid.  Her husband tested positive.  She is already feeling better.   The patient does have symptoms concerning for COVID-19 infection (fever, chills, cough, or new shortness of breath).    Past Medical History:  Diagnosis Date  . Allergic rhinitis   . Anxiety   . Atrial tachycardia ([place]) 01/27/2017  . Atypical nevus 03/27/2016   Left Mid Back-Moderate to Severe(w/s widershave)  . Atypical nevus 08/31/2017   Right Wrist-Atypical Proliferation ([place])  . Atypical nevus 04/12/2018   Right Upperarm-Atypical Proliferation ([place])  . Bradycardia   . Cancer (HCC)    breast  . Dizziness   . Essential hypertension 12/08/2016  . Fatigue 12/08/2016  . Frozen shoulder   . Headache   . Heart murmur   . Hyperlipidemia 12/08/2016  . Hypertension   . Lower extremity edema 12/08/2016  . Palpitations 12/08/2016  . Psoriasis   . Retinitis pigmentosa   . Shortness of breath 12/08/2016  . Thyroid disease   . Tinnitus   . Venous insufficiency    Past Surgical History:  Procedure Laterality Date  . ABLATION ON ENDOMETRIOSIS    . BREAST EXCISIONAL BIOPSY Right    fibrocystic cysts  . BREAST EXCISIONAL BIOPSY Left 2013  . cataracts    . CESAREAN SECTION     x 1  . eye surgeries    . moles excised     x 2  . NASAL SINUS SURGERY       Current Meds  Medication  Sig  . albuterol (PROVENTIL HFA;VENTOLIN HFA) 108 (90 Base) MCG/ACT inhaler Inhale 2 puffs into the lungs every 4 (four) hours as needed for shortness of breath.  Marland Kitchen aspirin EC 81 MG tablet Take 1 tablet (81 mg total) by mouth daily. Swallow whole.  Skipper Cliche SALINE NASAL NA Nasal wash  . cholecalciferol (VITAMIN D3) 25 MCG (1000 UNIT) tablet Take 2,000 Units by mouth daily.  . clobetasol cream (TEMOVATE) 6.19 % Apply 1 application topically as directed.  . Cyanocobalamin (B-12 PO)  Take 2 capsules by mouth daily.  . famotidine (PEPCID) 20 MG tablet Take 20 mg by mouth 2 (two) times daily.  . fexofenadine (ALLEGRA) 180 MG tablet Take 180 mg by mouth daily.  . flecainide (TAMBOCOR) 50 MG tablet Take 1 tablet (50 mg total) by mouth 2 (two) times daily.  Marland Kitchen ibuprofen (ADVIL,MOTRIN) 200 MG tablet Take 600 mg by mouth every 6 (six) hours as needed (tooth pain).  Marland Kitchen levothyroxine (SYNTHROID, LEVOTHROID) 75 MCG tablet Take 75 mcg by mouth daily before breakfast.  . propranolol (INDERAL) 10 MG tablet Take 1 tablet (10 mg total) by mouth 3 (three) times daily as needed. For palpitations/heart rates greater than 100 bpm.  . rizatriptan (MAXALT-MLT) 10 MG disintegrating tablet Take 1 tablet (10 mg total) by mouth as needed for migraine. May repeat in 2 hours if needed  . rosuvastatin (CRESTOR) 10 MG tablet Take 1 tablet (10 mg total) by mouth daily.  . sertraline (ZOLOFT) 50 MG tablet Take 50 mg by mouth daily.  . valsartan-hydrochlorothiazide (DIOVAN-HCT) 320-25 MG tablet Take 1 tablet by mouth daily.     Allergies:   Beta adrenergic blockers and Keflex [cephalexin]   Social History   Tobacco Use  . Smoking status: Never Smoker  . Smokeless tobacco: Never Used  Substance Use Topics  . Alcohol use: Not Currently  . Drug use: Never     Family Hx: [name] history includes Arrhythmia in her mother; Breast cancer in her maternal aunt; CAD in her father; Cancer in her father and mother; Heart attack in her maternal grandfather and paternal grandfather; Heart failure in her paternal grandmother; Hyperlipidemia in her mother; Hypertension in her brother, mother, and sister.  ROS:   Please see the history of present illness.     All other systems reviewed and are negative.   Prior CV studies:   The following studies were reviewed today:    Labs/Other Tests and Data Reviewed:    EKG:  No ECG reviewed.  Recent Labs: 09/15/2019: BUN 19; Creatinine, Ser 0.91;  Potassium 4.6; Sodium 140 03/19/2020: ALT 14   Recent Lipid Panel Lab Results  Component Value Date/Time   CHOL 153 03/19/2020 08:27 AM   TRIG 99 03/19/2020 08:27 AM   HDL 65 03/19/2020 08:27 AM   CHOLHDL 2.4 03/19/2020 08:27 AM   LDLCALC 70 03/19/2020 08:27 AM    Wt Readings from Last 3 Encounters:  07/09/20 190 lb (86.2 kg)  05/07/20 198 lb (89.8 kg)  01/16/20 192 lb 12.8 oz (87.5 kg)     Risk Assessment/Calculations:      Objective:    Vital Signs:  BP 109/74 (BP Location: Left Arm, Patient Position: Sitting)   Pulse 68   Ht 5\' 7"  (1.702 m)   Wt 190 lb (86.2 kg)   BMI 29.76 kg/m    VITAL SIGNS:  reviewed  ASSESSMENT & PLAN:    1. Peripheral arterial disease: The patient has evidence of moderate bilateral external  iliac artery disease and she does have bilateral bruits.  Currently with no claudication.  Recommend continuing medical therapy. I encouraged her to stay active and do regular exercise.  2. Hyperlipidemia: She is tolerating rosuvastatin 10 mg daily.  Most recent lipid profile showed improvement in LDL down to 70.  3. Essential hypertension: Blood pressures controlled on current medications.        COVID-19 Education: The signs and symptoms of COVID-19 were discussed with the patient and how to seek care for testing (follow up with PCP or arrange E-visit).  The importance of social distancing was discussed today.  Time:   Today, I have spent 8 minutes with the patient with telehealth technology discussing the above problems.     Medication Adjustments/Labs and Tests Ordered: Current medicines are reviewed at length with the patient today.  Concerns regarding medicines are outlined above.   Tests Ordered: No orders of the defined types were placed in this encounter.   Medication Changes: No orders of the defined types were placed in this encounter.   Follow Up:  In Person in 6 month(s)  Signed, Kathlyn Sacramento, MD  07/09/2020 8:00 AM    [place]

## 2020-07-22 ENCOUNTER — Other Ambulatory Visit: Payer: Self-pay | Admitting: Cardiology

## 2020-07-29 DIAGNOSIS — J069 Acute upper respiratory infection, unspecified: Secondary | ICD-10-CM | POA: Diagnosis not present

## 2020-07-29 DIAGNOSIS — Z20822 Contact with and (suspected) exposure to covid-19: Secondary | ICD-10-CM | POA: Diagnosis not present

## 2020-07-29 DIAGNOSIS — J0140 Acute pansinusitis, unspecified: Secondary | ICD-10-CM | POA: Diagnosis not present

## 2020-08-18 DIAGNOSIS — J0100 Acute maxillary sinusitis, unspecified: Secondary | ICD-10-CM | POA: Diagnosis not present

## 2020-08-29 NOTE — Progress Notes (Signed)
Chief Complaint  Patient presents with  . Follow-up    Routine followup Room 1, husband Doug in room     HISTORY OF PRESENT ILLNESS: 09/02/20 ALL:  Sheri Lopez is a 65 y.o. female here today for follow up for migraines. She was started on topiramate at consult with Dr Leta Baptist in 12/2019. She called about a month later with concerns of sores in her mouth and throat and topiramate discontinued. Amovig started and worked well but she could afford to continue. She is also concerned that it caused leg pains, however, has vascular disease that could be contributed. She restarted topiramate 06/2020. Rizatriptan for abortive therapy. She feels that headaches are better. She may have 7-10 headaches a month. She has used rizatriptan twice in 6 months. It did work well for abortive therapy. Anxiety is a major trigger for her. She takes propranolol 10mg  as needed for palpitations managed with cardiology. She tolerates it well but could not tolerate metoprolol daily.    HISTORY (copied from Dr Gladstone Lighter previous note)  65 year old female here for evaluation of headaches.  History of retinitis pigmentosa, hypertension, hyperlipidemia, thyroid disease, psoriasis, breast cancer.  Patient had headaches with migraine features in the 1990s.  These went away spontaneously.  Since 2020 has recurrence of similar but worsening headaches recently with right eye, right temporal, right posterior auricular pain, throbbing sensation, nausea, sensitivity to light and sound, dizziness.  She feels her heartbeat and throbbing sensation.  Triggers include allergies and fluorescent lights.  Headaches can last up to 3 days at a time.  Patient averaging up to 15 days of headache per month.  Sometimes she sees white spots and sparkles. Had MRI brain which was unremarkable in July 2020 for these symptoms.   Patient does have retinitis pigmentosa and has had multiple eye procedures and surgeries.  Patient has tried  Excedrin Migraine with mild relief.  Family history of migraine in her daughter.    REVIEW OF SYSTEMS: Out of a complete 14 system review of symptoms, the patient complains only of the following symptoms, headaches, leg pain, seasonal allergies and all other reviewed systems are negative.    ALLERGIES: Allergies  Allergen Reactions  . Beta Adrenergic Blockers     Lethargy, severe headaches  . Keflex [Cephalexin] Hives     HOME MEDICATIONS: Outpatient Medications Prior to Visit  Medication Sig Dispense Refill  . albuterol (PROVENTIL HFA;VENTOLIN HFA) 108 (90 Base) MCG/ACT inhaler Inhale 2 puffs into the lungs every 4 (four) hours as needed for shortness of breath. 1 Inhaler 2  . aspirin EC 81 MG tablet Take 1 tablet (81 mg total) by mouth daily. Swallow whole. 90 tablet 3  . AYR SALINE NASAL NA Nasal wash    . cholecalciferol (VITAMIN D3) 25 MCG (1000 UNIT) tablet Take 2,000 Units by mouth daily.    . clobetasol cream (TEMOVATE) 5.46 % Apply 1 application topically as directed.    . Cyanocobalamin (B-12 PO) Take 2 capsules by mouth daily.    . famotidine (PEPCID) 20 MG tablet Take 20 mg by mouth 2 (two) times daily.    . flecainide (TAMBOCOR) 50 MG tablet TAKE 1 TABLET(50 MG) BY MOUTH TWICE DAILY 180 tablet 3  . ibuprofen (ADVIL,MOTRIN) 200 MG tablet Take 600 mg by mouth every 6 (six) hours as needed (tooth pain).    Marland Kitchen levocetirizine (XYZAL) 2.5 MG/5ML solution Take 2.5 mg by mouth every evening.    Marland Kitchen levothyroxine (SYNTHROID, LEVOTHROID) 75 MCG tablet Take  75 mcg by mouth daily before breakfast.    . meclizine (ANTIVERT) 25 MG tablet Take 25 mg by mouth 2 (two) times daily as needed.    . rizatriptan (MAXALT-MLT) 10 MG disintegrating tablet Take 1 tablet (10 mg total) by mouth as needed for migraine. May repeat in 2 hours if needed 9 tablet 11  . rosuvastatin (CRESTOR) 10 MG tablet Take 1 tablet (10 mg total) by mouth daily. 90 tablet 3  . sertraline (ZOLOFT) 50 MG tablet Take 50 mg  by mouth daily.    . valsartan-hydrochlorothiazide (DIOVAN-HCT) 320-25 MG tablet Take 1 tablet by mouth daily. 90 tablet 3  . propranolol (INDERAL) 10 MG tablet Take 1 tablet (10 mg total) by mouth 3 (three) times daily as needed. For palpitations/heart rates greater than 100 bpm. 30 tablet 0  . fexofenadine (ALLEGRA) 180 MG tablet Take 180 mg by mouth daily.     No facility-administered medications prior to visit.     PAST MEDICAL HISTORY: Past Medical History:  Diagnosis Date  . Allergic rhinitis   . Anxiety   . Atrial tachycardia ([place]) 01/27/2017  . Atypical nevus 03/27/2016   Left Mid Back-Moderate to Severe(w/s widershave)  . Atypical nevus 08/31/2017   Right Wrist-Atypical Proliferation ([place])  . Atypical nevus 04/12/2018   Right Upperarm-Atypical Proliferation ([place])  . Bradycardia   . Cancer (HCC)    breast  . Dizziness   . Essential hypertension 12/08/2016  . Fatigue 12/08/2016  . Frozen shoulder   . Headache   . Heart murmur   . Hyperlipidemia 12/08/2016  . Hypertension   . Lower extremity edema 12/08/2016  . Palpitations 12/08/2016  . Psoriasis   . Retinitis pigmentosa   . Shortness of breath 12/08/2016  . Thyroid disease   . Tinnitus   . Venous insufficiency      PAST SURGICAL HISTORY: Past Surgical History:  Procedure Laterality Date  . ABLATION ON ENDOMETRIOSIS    . BREAST EXCISIONAL BIOPSY Right    fibrocystic cysts  . BREAST EXCISIONAL BIOPSY Left 2013  . cataracts    . CESAREAN SECTION     x 1  . eye surgeries    . moles excised     x 2  . NASAL SINUS SURGERY       FAMILY HISTORY: Family History  Problem Relation Age of Onset  . Breast cancer Maternal Aunt   . Cancer Mother   . Hyperlipidemia Mother   . Hypertension Mother   . Arrhythmia Mother   . Cancer Father   . CAD Father   . Heart attack Maternal Grandfather   . Heart failure Paternal Grandmother   . Heart attack Paternal Grandfather   . Hypertension Sister   .  Hypertension Brother      SOCIAL HISTORY: Social History   Socioeconomic History  . Marital status: Married    Spouse name: Marden Noble  . Number of children: 3  . Years of education: Not on file  . Highest education level: Bachelor's degree (e.g., BA, AB, BS)  Occupational History  . Not on file  Tobacco Use  . Smoking status: Never Smoker  . Smokeless tobacco: Never Used  Substance and Sexual Activity  . Alcohol use: Not Currently  . Drug use: Never  . Sexual activity: Not on file  Other Topics Concern  . Not on file  Social History Narrative   Lives with husband   Social Determinants of Health   Financial Resource Strain: Not on file  Food Insecurity: Not on file  Transportation Needs: Not on file  Physical Activity: Not on file  Stress: Not on file  Social Connections: Not on file  Intimate Partner Violence: Not on file      PHYSICAL EXAM  Vitals:   09/02/20 1032  BP: 101/65  Pulse: 60  Weight: 191 lb (86.6 kg)  Height: 5\' 7"  (1.702 m)   Body mass index is 29.91 kg/m.   Generalized: Well developed, in no acute distress  Cardiology: normal rate and rhythm, no murmur auscultated  Respiratory: clear to auscultation bilaterally    Neurological examination  Mentation: Alert oriented to time, place, history taking. Follows all commands speech and language fluent Cranial nerve II-XII: Pupils not reactive, left irregular post surgery. Extraocular movements were full. Decreased visual acuity centrally. Facial sensation and strength were normal. Head turning and shoulder shrug  were normal and symmetric. Motor: The motor testing reveals 5 over 5 strength of all 4 extremities. Good symmetric motor tone is noted throughout.  Gait and station: Gait is normal. Uses white cane.     DIAGNOSTIC DATA (LABS, IMAGING, TESTING) - I reviewed patient records, labs, notes, testing and imaging myself where available.  No results found for: WBC, HGB, HCT, MCV, PLT     Component Value Date/Time   NA 140 09/15/2019 1027   K 4.6 09/15/2019 1027   CL 99 09/15/2019 1027   CO2 25 09/15/2019 1027   GLUCOSE 87 09/15/2019 1027   BUN 19 09/15/2019 1027   CREATININE 0.91 09/15/2019 1027   CALCIUM 9.5 09/15/2019 1027   PROT 6.7 03/19/2020 0827   ALBUMIN 4.4 03/19/2020 0827   AST 19 03/19/2020 0827   ALT 14 03/19/2020 0827   ALKPHOS 55 03/19/2020 0827   BILITOT 0.4 03/19/2020 0827   GFRNONAA 67 09/15/2019 1027   GFRAA 77 09/15/2019 1027   Lab Results  Component Value Date   CHOL 153 03/19/2020   HDL 65 03/19/2020   LDLCALC 70 03/19/2020   TRIG 99 03/19/2020   CHOLHDL 2.4 03/19/2020   No results found for: HGBA1C No results found for: VITAMINB12 No results found for: TSH  No flowsheet data found.   No flowsheet data found.   ASSESSMENT AND PLAN  65 y.o. year old female  has a past medical history of Allergic rhinitis, Anxiety, Atrial tachycardia ([place]) (01/27/2017), Atypical nevus (03/27/2016), Atypical nevus (08/31/2017), Atypical nevus (04/12/2018), Bradycardia, Cancer ([place]), Dizziness, Essential hypertension (12/08/2016), Fatigue (12/08/2016), Frozen shoulder, Headache, Heart murmur, Hyperlipidemia (12/08/2016), Hypertension, Lower extremity edema (12/08/2016), Palpitations (12/08/2016), Psoriasis, Retinitis pigmentosa, Shortness of breath (12/08/2016), Thyroid disease, Tinnitus, and Venous insufficiency. here with   Migraine with aura and without status migrainosus, not intractable  Sheri Lopez reports improvement in headache frequency since starting topiramate 50mg  daily. She is concerned about increased fatigue with twice daily dosing. Anxiety is common trigger. She is tolerating propranolol for palpitations PRN. I will have her take propranolol 10mg  daily in the mornings with topiramate 50mg  at night to see if this is helpful for both headaches and anxiety. She may take both up to BID if helpful. She will continue rizatriptan as needed for migraine  abortion. Healthy lifestyle habits encouraged. May consider appeal with insurance if medications not tolerated. She will follow up in 3-6 months. She and her husband verbalize understanding and agreement with this plan.   No orders of the defined types were placed in this encounter.    Meds ordered this encounter  Medications  . propranolol (INDERAL) 10 MG  tablet    Sig: Take 1 tablet (10 mg total) by mouth 2 (two) times daily. For palpitations/heart rates greater than 100 bpm.    Dispense:  60 tablet    Refill:  11    Order Specific Question:   Supervising Provider    Answer:   Melvenia Beam [id number]  . topiramate (TOPAMAX) 50 MG tablet    Sig: Take 1 tablet (50 mg total) by mouth 2 (two) times daily.    Dispense:  60 tablet    Refill:  11    Order Specific Question:   Supervising Provider    Answer:   Melvenia Beam [id number]      I spent 20 minutes of face-to-face and non-face-to-face time with patient.  This included previsit chart review, lab review, study review, order entry, electronic health record documentation, patient education.    Debbora Presto, MSN, FNP-C 09/02/2020, 11:28 AM  [facility] [address], [place], [place] [address] [phone]

## 2020-08-29 NOTE — Patient Instructions (Signed)
Below is our plan:  We will continue topiramate 50mg  daily. May increase dose to twice daily if helpful. Start propranolol 10mg  daily to see if you can tolerate it. May increase dose to 10mg  twice daily if you can tolerate it. Continue rizatriptan for intractable migraines. Call your insurance to see if they will cover Ajovy or Emgality. I will be happy to start one of these injections if you wish. We can consider Botox therapy as well if nothing else works.   Please make sure you are staying well hydrated. I recommend 50-60 ounces daily. Well balanced diet and regular exercise encouraged. Consistent sleep schedule with 6-8 hours recommended.   Please continue follow up with care team as directed.   Follow up with me in 3-6 months   You may receive a survey regarding today's visit. I encourage you to leave honest feed back as I do use this information to improve patient care. Thank you for seeing me today!      Migraine Headache A migraine headache is a very strong throbbing pain on one side or both sides of your head. This type of headache can also cause other symptoms. It can last from 4 hours to 3 days. Talk with your doctor about what things may bring on (trigger) this condition. What are the causes? The exact cause of this condition is not known. This condition may be triggered or caused by:  Drinking alcohol.  Smoking.  Taking medicines, such as: ? Medicine used to treat chest pain (nitroglycerin). ? Birth control pills. ? Estrogen. ? Some blood pressure medicines.  Eating or drinking certain products.  Doing physical activity. Other things that may trigger a migraine headache include:  Having a menstrual period.  Pregnancy.  Hunger.  Stress.  Not getting enough sleep or getting too much sleep.  Weather changes.  Tiredness (fatigue). What increases the risk?  Being 87-31 years old.  Being female.  Having a family history of migraine headaches.  Being  Caucasian.  Having depression or anxiety.  Being very overweight. What are the signs or symptoms?  A throbbing pain. This pain may: ? Happen in any area of the head, such as on one side or both sides. ? Make it hard to do daily activities. ? Get worse with physical activity. ? Get worse around bright lights or loud noises.  Other symptoms may include: ? Feeling sick to your stomach (nauseous). ? Vomiting. ? Dizziness. ? Being sensitive to bright lights, loud noises, or smells.  Before you get a migraine headache, you may get warning signs (an aura). An aura may include: ? Seeing flashing lights or having blind spots. ? Seeing bright spots, halos, or zigzag lines. ? Having tunnel vision or blurred vision. ? Having numbness or a tingling feeling. ? Having trouble talking. ? Having weak muscles.  Some people have symptoms after a migraine headache (postdromal phase), such as: ? Tiredness. ? Trouble thinking (concentrating). How is this treated?  Taking medicines that: ? Relieve pain. ? Relieve the feeling of being sick to your stomach. ? Prevent migraine headaches.  Treatment may also include: ? Having acupuncture. ? Avoiding foods that bring on migraine headaches. ? Learning ways to control your body functions (biofeedback). ? Therapy to help you know and deal with negative thoughts (cognitive behavioral therapy). Follow these instructions at home: Medicines  Take over-the-counter and prescription medicines only as told by your doctor.  Ask your doctor if the medicine prescribed to you: ? Requires you to  avoid driving or using heavy machinery. ? Can cause trouble pooping (constipation). You may need to take these steps to prevent or treat trouble pooping:  Drink enough fluid to keep your pee (urine) pale yellow.  Take over-the-counter or prescription medicines.  Eat foods that are high in fiber. These include beans, whole grains, and fresh fruits and  vegetables.  Limit foods that are high in fat and sugar. These include fried or sweet foods. Lifestyle  Do not drink alcohol.  Do not use any products that contain nicotine or tobacco, such as cigarettes, e-cigarettes, and chewing tobacco. If you need help quitting, ask your doctor.  Get at least 8 hours of sleep every night.  Limit and deal with stress. General instructions  Keep a journal to find out what may bring on your migraine headaches. For example, write down: ? What you eat and drink. ? How much sleep you get. ? Any change in what you eat or drink. ? Any change in your medicines.  If you have a migraine headache: ? Avoid things that make your symptoms worse, such as bright lights. ? It may help to lie down in a dark, quiet room. ? Do not drive or use heavy machinery. ? Ask your doctor what activities are safe for you.  Keep all follow-up visits as told by your doctor. This is important.      Contact a doctor if:  You get a migraine headache that is different or worse than others you have had.  You have more than 15 headache days in one month. Get help right away if:  Your migraine headache gets very bad.  Your migraine headache lasts longer than 72 hours.  You have a fever.  You have a stiff neck.  You have trouble seeing.  Your muscles feel weak or like you cannot control them.  You start to lose your balance a lot.  You start to have trouble walking.  You pass out (faint).  You have a seizure. Summary  A migraine headache is a very strong throbbing pain on one side or both sides of your head. These headaches can also cause other symptoms.  This condition may be treated with medicines and changes to your lifestyle.  Keep a journal to find out what may bring on your migraine headaches.  Contact a doctor if you get a migraine headache that is different or worse than others you have had.  Contact your doctor if you have more than 15 headache  days in a month. This information is not intended to replace advice given to you by your health care provider. Make sure you discuss any questions you have with your health care provider. Document Revised: 09/09/2018 Document Reviewed: 06/30/2018 Elsevier Patient Education  [place].

## 2020-09-02 ENCOUNTER — Encounter: Payer: Self-pay | Admitting: Family Medicine

## 2020-09-02 ENCOUNTER — Ambulatory Visit: Payer: Medicare Other | Admitting: Family Medicine

## 2020-09-02 VITALS — BP 101/65 | HR 60 | Ht 67.0 in | Wt 191.0 lb

## 2020-09-02 DIAGNOSIS — G43109 Migraine with aura, not intractable, without status migrainosus: Secondary | ICD-10-CM | POA: Diagnosis not present

## 2020-09-02 MED ORDER — TOPIRAMATE 50 MG PO TABS
50.0000 mg | ORAL_TABLET | Freq: Two times a day (BID) | ORAL | 11 refills | Status: DC
Start: 2020-09-02 — End: 2021-11-12

## 2020-09-02 MED ORDER — PROPRANOLOL HCL 10 MG PO TABS: 10.0000 mg | ORAL_TABLET | Freq: Two times a day (BID) | ORAL | 11 refills | Status: DC

## 2020-09-24 ENCOUNTER — Encounter: Payer: Self-pay | Admitting: Family Medicine

## 2020-09-26 ENCOUNTER — Other Ambulatory Visit: Payer: Self-pay | Admitting: Family Medicine

## 2020-09-26 MED ORDER — PROPRANOLOL HCL 20 MG PO TABS: 20.0000 mg | ORAL_TABLET | Freq: Two times a day (BID) | ORAL | 5 refills | Status: DC

## 2020-09-28 DIAGNOSIS — I10 Essential (primary) hypertension: Secondary | ICD-10-CM | POA: Diagnosis not present

## 2020-09-28 DIAGNOSIS — K219 Gastro-esophageal reflux disease without esophagitis: Secondary | ICD-10-CM | POA: Diagnosis not present

## 2020-09-28 DIAGNOSIS — E039 Hypothyroidism, unspecified: Secondary | ICD-10-CM | POA: Diagnosis not present

## 2020-09-28 DIAGNOSIS — J4520 Mild intermittent asthma, uncomplicated: Secondary | ICD-10-CM | POA: Diagnosis not present

## 2020-10-13 IMAGING — CT CT MAXILLOFACIAL W/O CM
3 of 5 series · 14 of 47 positions shown, 16 images · non-contrast
Comparison: Brain MRI 12/29/2018

CLINICAL DATA: Sinusitis, unspecified chronicity, unspecified
location. Additional history provided by technologist: Pain and
pressure right maxillary and right ear, last round of antibiotics
and steroids [DATE], history of sinus surgery, history of melanoma
left arm excision.

EXAM:
CT MAXILLOFACIAL WITHOUT CONTRAST
TECHNIQUE: Multidetector CT imaging of the maxillofacial structures was
performed. Multiplanar CT image reconstructions were also generated.

[Series 2: sinus 2.00 hr60 s3 axial · axial · 0.33mm/px · z∈[-650,-548]mm · 8 of 67 slices shown, 10 images]
[im 8/67  brain]
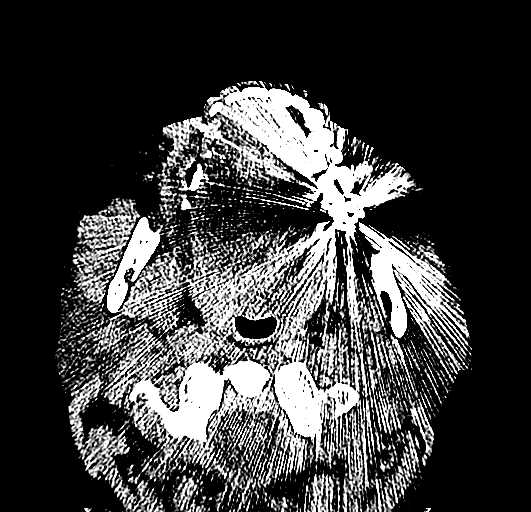
[im 8/67  bone]
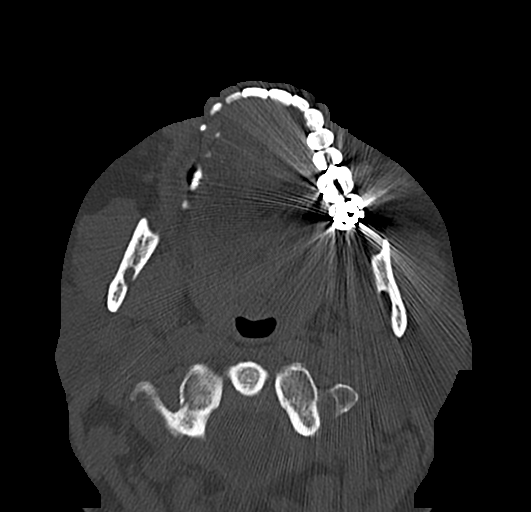
[im 15/67  bone]
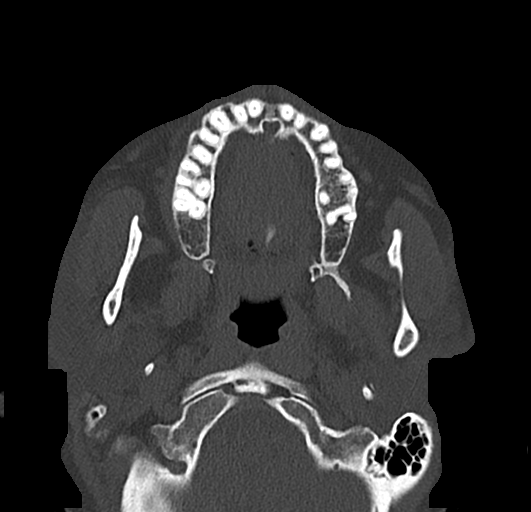
[im 23/67  bone]
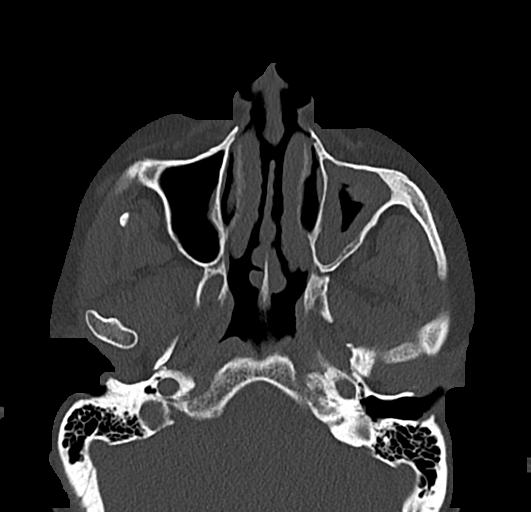
[im 30/67  bone]
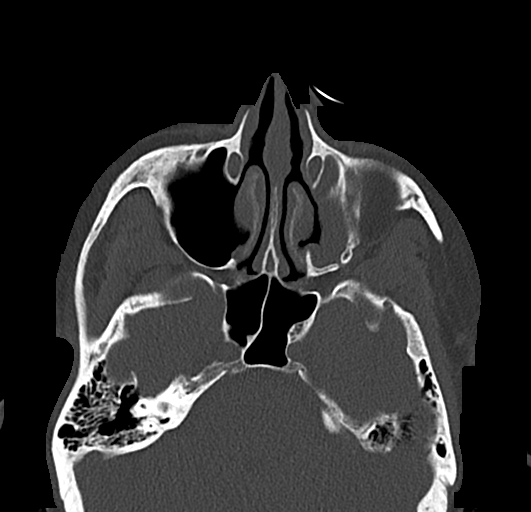
[im 37/67  brain]
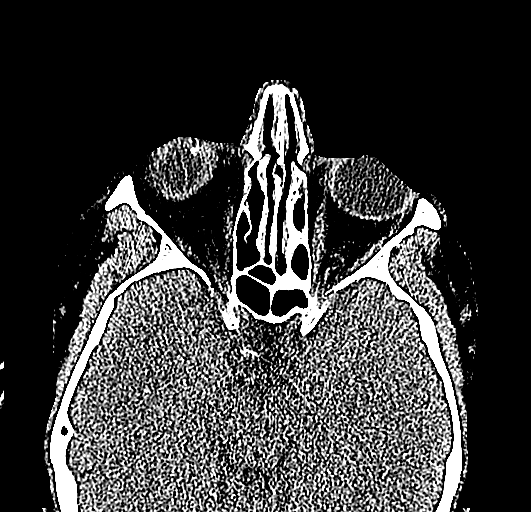
[im 37/67  bone]
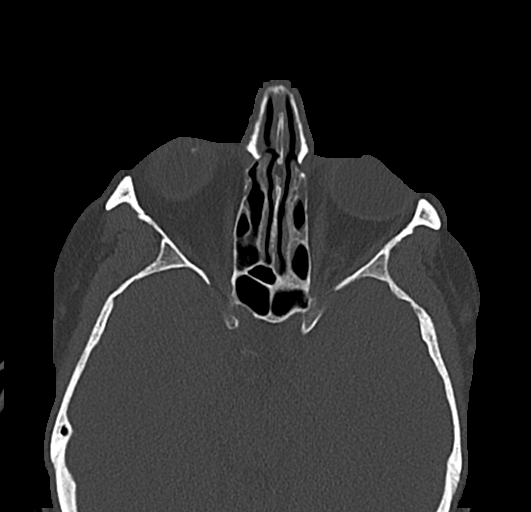
[im 45/67  bone]
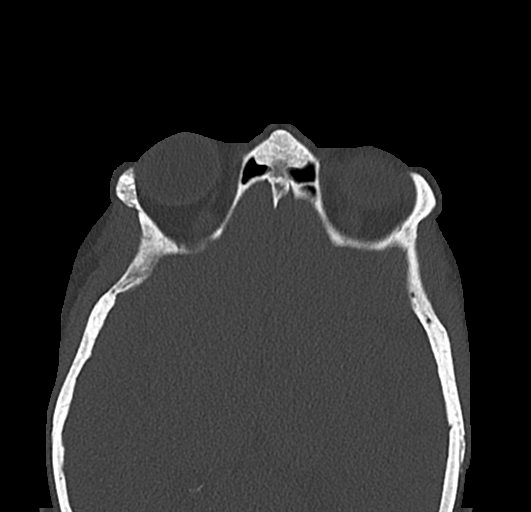
[im 52/67  bone]
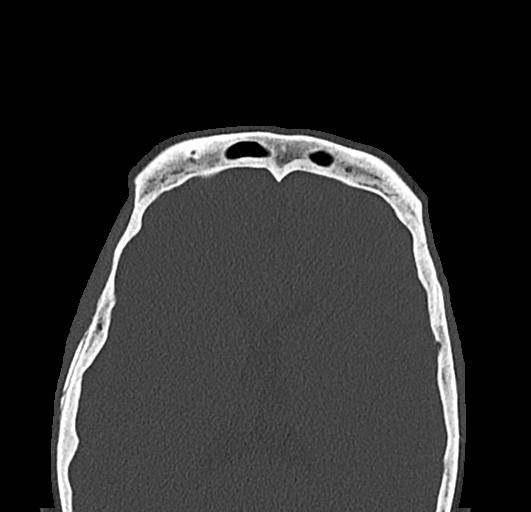
[im 59/67  bone]
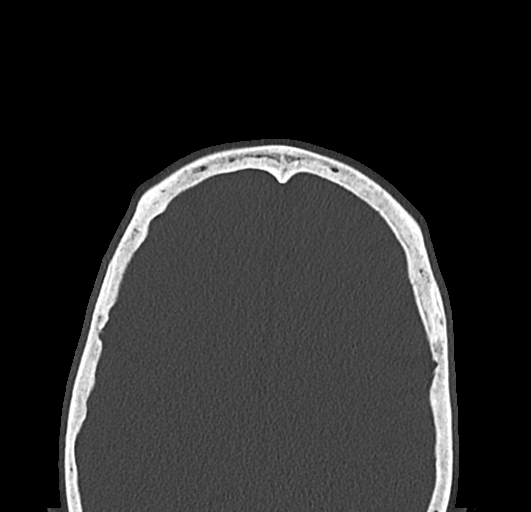

[Series 4: sinus 2.00 hr60 s3 cor · coronal · 0.26mm/px · 3 of 85 slices shown]
[im 29/85  bone]
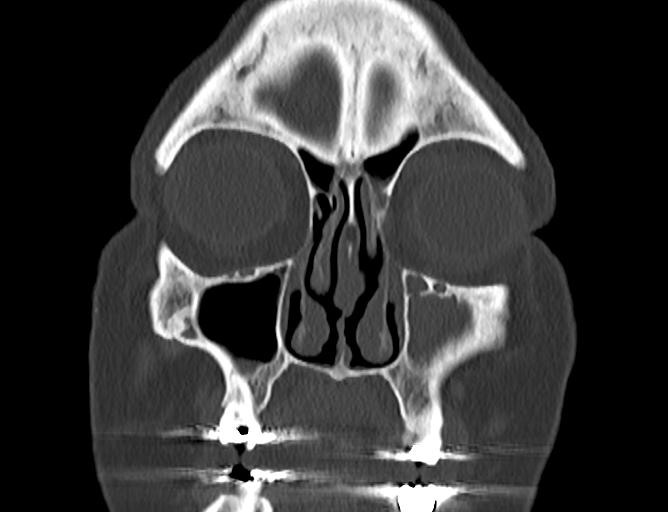
[im 38/85  bone]
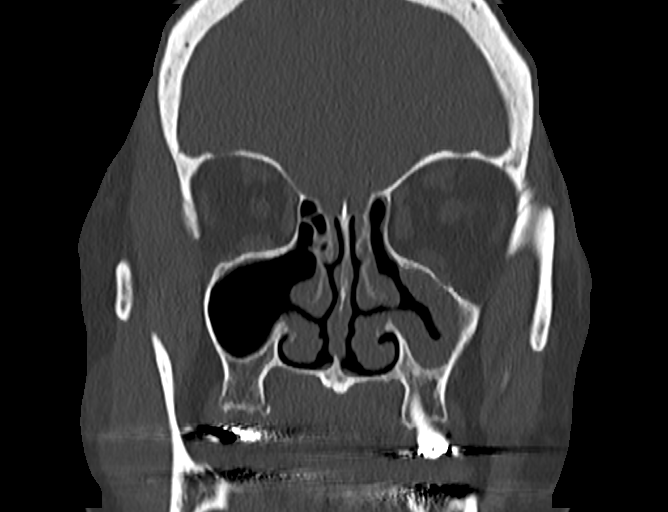
[im 47/85  bone]
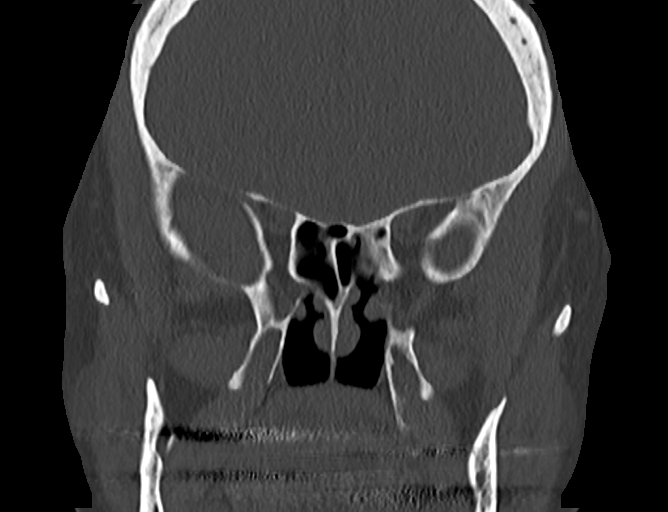

[Series 6: sinus 2.00 hr60 s3 sag · sagittal · 0.26mm/px · 3 of 88 slices shown]
[im 30/88  bone]
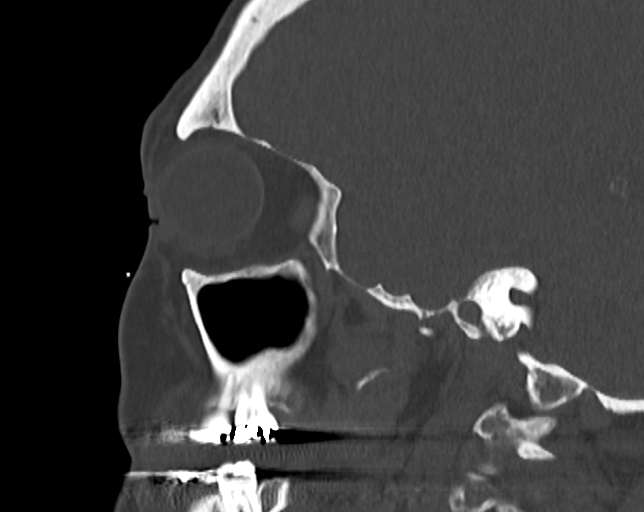
[im 44/88  bone]
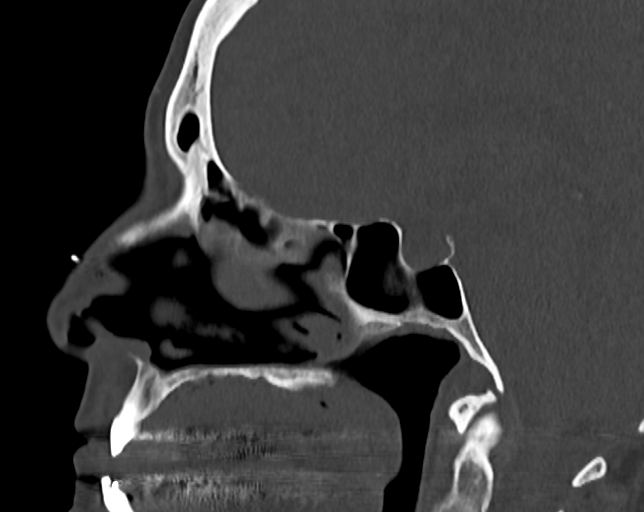
[im 59/88  bone]
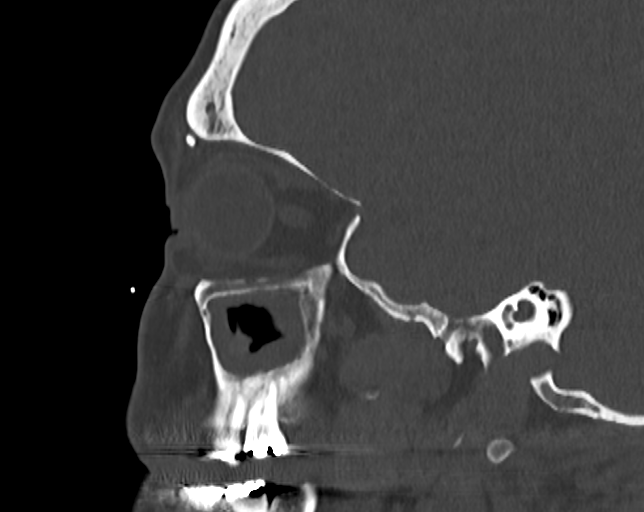

[14 of 47 positions shown; findings below may reference images not displayed]

FINDINGS: Osseous: No maxillofacial fracture

Orbits: Anterior globe calcifications on the right. Otherwise
unremarkable.

Sinuses:

Frontal: The frontal sinuses are normally aerated. The left frontal
sinus drainage pathway is partially opacified. The right frontal
sinus drainage pathways patent.

Ethmoid: Postoperative changes from prior bilateral ethmoidectomy.
Mild scattered mucosal thickening within the ethmoidectomy cavities.

Maxillary: Bilateral maxillary antrostomies. Mild right maxillary
sinus mucosal thickening. Moderate/severe left maxillary sinus
mucosal thickening.

Sphenoid: Mild mucosal thickening within the anterior sphenoid
sinuses, narrowing the sphenoid sinus ostia bilaterally. The
sphenoethmoidal recesses are patent, although narrowed by mucosal
thickening.

Right ostiomeatal unit: Widely patent antrostomy.

Left ostiomeatal unit: Patent antrostomy although somewhat narrowed
by maxillary sinus mucosal thickening.

Nasal passages: Patent. Intact nasal septum is midline. Mucosal
edema of the bilateral middle and inferior turbinates.

Anatomy: No pneumatization superior to anterior ethmoid notches.
Symmetric and intact olfactory grooves and fovea ethmoidalis, Keros
II (4-7mm) Sellar sphenoid pneumatization pattern. No dehiscence of
carotid or optic canals. No onodi cell.

Soft tissues: The visualized maxillofacial and upper neck soft
tissues are unremarkable.

Limited intracranial: No abnormality identified
IMPRESSION: Postsurgical changes to the paranasal sinuses with bilateral
antrostomies and sequela of prior bilateral ethmoidectomy.

Moderate/severe left maxillary sinus mucosal thickening. Sites of
lesser mucosal thickening as described.

The left frontal sinus drainage pathway is partially opacified.
Mucosal thickening narrows the bilateral sphenoid sinus ostia and
sphenoethmoidal recesses, as well as left antrostomy.

Bilateral middle and inferior turbinate mucosal edema.

## 2020-10-21 ENCOUNTER — Other Ambulatory Visit: Payer: Self-pay | Admitting: Adult Health

## 2020-10-21 DIAGNOSIS — R5383 Other fatigue: Secondary | ICD-10-CM | POA: Diagnosis not present

## 2020-10-21 DIAGNOSIS — R059 Cough, unspecified: Secondary | ICD-10-CM | POA: Diagnosis not present

## 2020-10-21 DIAGNOSIS — R221 Localized swelling, mass and lump, neck: Secondary | ICD-10-CM | POA: Diagnosis not present

## 2020-10-21 NOTE — Telephone Encounter (Signed)
Rx(s) sent to pharmacy electronically.  

## 2020-10-23 ENCOUNTER — Other Ambulatory Visit: Payer: Self-pay | Admitting: Family Medicine

## 2020-10-23 DIAGNOSIS — R221 Localized swelling, mass and lump, neck: Secondary | ICD-10-CM

## 2020-10-25 ENCOUNTER — Ambulatory Visit
Admission: RE | Admit: 2020-10-25 | Discharge: 2020-10-25 | Disposition: A | Discharge status: Home / Self Care | Payer: Medicare Other | Source: Ambulatory Visit | Attending: Family Medicine | Admitting: Family Medicine

## 2020-10-25 DIAGNOSIS — R221 Localized swelling, mass and lump, neck: Secondary | ICD-10-CM

## 2020-11-06 ENCOUNTER — Other Ambulatory Visit: Payer: Self-pay

## 2020-11-06 ENCOUNTER — Encounter: Payer: Self-pay | Admitting: Physician Assistant

## 2020-11-06 ENCOUNTER — Ambulatory Visit: Payer: Medicare Other | Admitting: Physician Assistant

## 2020-11-06 DIAGNOSIS — Z86018 Personal history of other benign neoplasm: Secondary | ICD-10-CM | POA: Diagnosis not present

## 2020-11-06 DIAGNOSIS — Z1283 Encounter for screening for malignant neoplasm of skin: Secondary | ICD-10-CM | POA: Diagnosis not present

## 2020-11-06 NOTE — Progress Notes (Signed)
   Follow-Up Visit   Subjective  Sheri Lopez is a 65 y.o. female who presents for the following: Annual Exam (Patient here today for full body skin check no concerns. Personal history of atypical moles. No personal history of melanoma or non mole skin cancer. No family history of atypical moles, melanoma or non mole skin cancer. ).   The following portions of the chart were reviewed this encounter and updated as appropriate:  Tobacco  Allergies  Meds  Problems  Med Hx  Surg Hx  Fam Hx      Objective  Well appearing patient in no apparent distress; mood and affect are within normal limits.  A full examination was performed including scalp, head, eyes, ears, nose, lips, neck, chest, axillae, abdomen, back, buttocks, bilateral upper extremities, bilateral lower extremities, hands, feet, fingers, toes, fingernails, and toenails. All findings within normal limits unless otherwise noted below.  Objective  Head to toe: Dyspigmented scar. No atypical nevi or signs of NMSC noted at the time of the visit.    Assessment & Plan  Skin exam for malignant neoplasm Head to toe  Yearly skin check    I, Evanie Buckle, PA-C, have reviewed all documentation's for this visit.  The documentation on 11/06/20 for the exam, diagnosis, procedures and orders are all accurate and complete.

## 2020-11-11 ENCOUNTER — Ambulatory Visit: Payer: Medicare Other | Admitting: Cardiology

## 2020-11-11 ENCOUNTER — Other Ambulatory Visit: Payer: Self-pay

## 2020-11-11 ENCOUNTER — Encounter: Payer: Self-pay | Admitting: Cardiology

## 2020-11-11 VITALS — BP 116/74 | HR 53 | Ht 67.0 in | Wt 195.0 lb

## 2020-11-11 DIAGNOSIS — I471 Supraventricular tachycardia: Secondary | ICD-10-CM

## 2020-11-11 NOTE — Patient Instructions (Signed)
Medication Instructions:  °Your physician recommends that you continue on your current medications as directed. Please refer to the Current Medication list given to you today. ° °*If you need a refill on your cardiac medications before your next appointment, please call your pharmacy* ° ° °Lab Work: °None ordered ° ° °Testing/Procedures: °None ordered ° ° °Follow-Up: °At [facility], you and your health needs are our priority.  As part of our continuing mission to provide you with exceptional heart care, we have created designated Provider Care Teams.  These Care Teams include your primary Cardiologist (physician) and Advanced Practice Providers (APPs -  Physician Assistants and Nurse Practitioners) who all work together to provide you with the care you need, when you need it. ° °Your next appointment:   °1 year(s) ° °The format for your next appointment:   °In Person ° °Provider:   °Will Camnitz, MD ° ° ° °Thank you for choosing [facility]!! ° ° °Otha Monical, RN °[phone] ° ° ° °

## 2020-11-11 NOTE — Progress Notes (Signed)
Electrophysiology Office Note   Date:  11/11/2020   ID:  Sheri Lopez, DOB 09-Jan-1956, MRN [id number]  PCP:  Carolee Rota, NP  Cardiologist:  [name] Primary Electrophysiologist:  Bryon Parker [name], MD    No chief complaint on file.    History of Present Illness: Sheri Lopez is a 65 y.o. female who is being seen today for the evaluation of SVT at the request of Carolee Rota, NP. Presenting today for electrophysiology evaluation.   She has a history significant for hypertension, cardiac, legal blindness, and breast cancer.  She had an echo that showed normal systolic function and a Myoview with normal function and no ischemia.  She began having palpitations and wore a 14 showed PACs, PVCs, and atrial tachycardia.  She was started on flecainide.  Today, denies symptoms of palpitations, chest pain, shortness of breath, orthopnea, PND, lower extremity edema, claudication, dizziness, presyncope, syncope, bleeding, or neurologic sequela. The patient is tolerating medications without difficulties.  Since being seen she has done well.  She has no chest pain or shortness of breath.  She has had minimal palpitations and is overall comfortable with her control.  Past Medical History:  Diagnosis Date   Allergic rhinitis    Anxiety    Atrial tachycardia ([place]) 01/27/2017   Atypical nevus 03/27/2016   Left Mid Back-Moderate to Severe(w/s widershave)   Atypical nevus 08/31/2017   Right Wrist-Atypical Proliferation ([place])   Atypical nevus 04/12/2018   Right Upperarm-Atypical Proliferation ([place])   Bradycardia    Cancer ([facility])    breast   Dizziness    Essential hypertension 12/08/2016   Fatigue 12/08/2016   Frozen shoulder    Headache    Heart murmur    Hyperlipidemia 12/08/2016   Hypertension    Lower extremity edema 12/08/2016   Palpitations 12/08/2016   Psoriasis    Retinitis pigmentosa    Shortness of breath 12/08/2016   Thyroid disease    Tinnitus    Venous  insufficiency    Past Surgical History:  Procedure Laterality Date   ABLATION ON ENDOMETRIOSIS     BREAST EXCISIONAL BIOPSY Right    fibrocystic cysts   BREAST EXCISIONAL BIOPSY Left 2013   cataracts     CESAREAN SECTION     x 1   eye surgeries     moles excised     x 2   NASAL SINUS SURGERY       Current Outpatient Medications  Medication Sig Dispense Refill   albuterol (PROVENTIL HFA;VENTOLIN HFA) 108 (90 Base) MCG/ACT inhaler Inhale 2 puffs into the lungs every 4 (four) hours as needed for shortness of breath. 1 Inhaler 2   aspirin EC 81 MG tablet Take 1 tablet (81 mg total) by mouth daily. Swallow whole. 90 tablet 3   AYR SALINE NASAL NA Nasal wash     cholecalciferol (VITAMIN D3) 25 MCG (1000 UNIT) tablet Take 2,000 Units by mouth daily.     clobetasol cream (TEMOVATE) 4.97 % Apply 1 application topically as directed.     Cyanocobalamin (B-12 PO) Take 2 capsules by mouth daily.     famotidine (PEPCID) 20 MG tablet Take 20 mg by mouth 2 (two) times daily.     flecainide (TAMBOCOR) 50 MG tablet TAKE 1 TABLET(50 MG) BY MOUTH TWICE DAILY 180 tablet 3   ibuprofen (ADVIL,MOTRIN) 200 MG tablet Take 600 mg by mouth every 6 (six) hours as needed (tooth pain).     levothyroxine (SYNTHROID, LEVOTHROID) 75 MCG tablet  Take 75 mcg by mouth daily before breakfast.     meclizine (ANTIVERT) 25 MG tablet Take 25 mg by mouth 2 (two) times daily as needed.     propranolol (INDERAL) 20 MG tablet Take 1 tablet (20 mg total) by mouth 2 (two) times daily. For palpitations/heart rates greater than 100 bpm. 60 tablet 5   rizatriptan (MAXALT-MLT) 10 MG disintegrating tablet Take 1 tablet (10 mg total) by mouth as needed for migraine. May repeat in 2 hours if needed 9 tablet 11   rosuvastatin (CRESTOR) 10 MG tablet Take 1 tablet (10 mg total) by mouth daily. 90 tablet 3   sertraline (ZOLOFT) 50 MG tablet Take 50 mg by mouth daily.     topiramate (TOPAMAX) 50 MG tablet Take 1 tablet (50 mg total) by mouth  2 (two) times daily. 60 tablet 11   valsartan-hydrochlorothiazide (DIOVAN-HCT) 320-25 MG tablet TAKE 1 TABLET BY MOUTH DAILY 90 tablet 3   No current facility-administered medications for this visit.    Allergies:   Beta adrenergic blockers, Topamax [topiramate], and Keflex [cephalexin]   Social History:  The patient  reports that she has never smoked. She has never used smokeless tobacco. She reports previous alcohol use. She reports that she does not use drugs.   Family History:  [name] history includes Arrhythmia in her mother; Breast cancer in her maternal aunt; CAD in her father; Cancer in her father and mother; Heart attack in her maternal grandfather and paternal grandfather; Heart failure in her paternal grandmother; Hyperlipidemia in her mother; Hypertension in her brother, mother, and sister.   ROS:  Please see the history of present illness.   Otherwise, review of systems is positive for none.   All other systems are reviewed and negative.   PHYSICAL EXAM: VS:  BP 116/74   Pulse (!) 53   Ht 5\' 7"  (1.702 m)   Wt 195 lb (88.5 kg)   BMI 30.54 kg/m  , BMI Body mass index is 30.54 kg/m. GEN: Well nourished, well developed, in no acute distress  HEENT: normal  Neck: no JVD, carotid bruits, or masses Cardiac: RRR; no murmurs, rubs, or gallops,no edema  Respiratory:  clear to auscultation bilaterally, normal work of breathing GI: soft, nontender, nondistended, + BS MS: no deformity or atrophy  Skin: warm and dry Neuro:  Strength and sensation are intact Psych: euthymic mood, full affect  EKG:  EKG is ordered today. Personal review of the ekg ordered shows sinus rhythm, rate 53  Recent Labs: 03/19/2020: ALT 14    Lipid Panel     Component Value Date/Time   CHOL 153 03/19/2020 0827   TRIG 99 03/19/2020 0827   HDL 65 03/19/2020 0827   CHOLHDL 2.4 03/19/2020 0827   LDLCALC 70 03/19/2020 0827     Wt Readings from Last 3 Encounters:  11/11/20 195 lb  (88.5 kg)  09/02/20 191 lb (86.6 kg)  07/09/20 190 lb (86.2 kg)      Other studies Reviewed: Additional studies/ records that were reviewed today include: Myoview 12/10/16  Review of the above records today demonstrates:  Normal perfusion No ischemia Nuclear stress EF: 59%. Low risk study  TTE 12/21/16 - Left ventricle: The cavity size was normal. Systolic function was   normal. The estimated ejection fraction was in the range of 55%   to 60%. Wall motion was normal; there were no regional wall   motion abnormalities. Left ventricular diastolic function   parameters were normal.  Cardiac  monitor 01/26/17 - personally reviewed Predominant rhythm: sinus rhythm, sinus bradycardia Average heart rate: 62 bpm   Sinus rhythm and sinus bradycardia   PACs, PVCs Atrial tachycardia  ETT 02/17/17 Blood pressure demonstrated a normal response to exercise. Nonspecific ST changes noted during exercise. Immediate recovery there was 71mm of horizontal ST segment depression in the inferolateral leads Abnormal exercise stress test in recovery. The patient had no symptoms during the stress test. Submaximal ETT with patient achieving only 74% maximum predicted heart rate. The patient achieved 9.1 mets. Recommed pharmacolgic myocardial perfusion scan.  ASSESSMENT AND PLAN:  1.  Atrial tachycardia: Currently on propranolol and flecainide.  High risk medication monitoring.  She is fortunately remained in sinus rhythm and has minimal palpitations.  We Lonette Stevison continue with current management.  2.  Hyperlipidemia: Continue Crestor per primary care  Current medicines are reviewed at length with the patient today.   The patient does not have concerns regarding her medicines.  The following changes were made today: None  Labs/ tests ordered today include:  Orders Placed This Encounter  Procedures   EKG 12-Lead      Disposition:   FU with Mellany Dinsmore 12 months  Signed, Clotiel Troop [name], MD   11/11/2020 11:42 AM     [address] [place] [id number] [phone] (office) [phone] (fax)

## 2020-11-12 DIAGNOSIS — J4520 Mild intermittent asthma, uncomplicated: Secondary | ICD-10-CM | POA: Diagnosis not present

## 2020-11-12 DIAGNOSIS — N183 Chronic kidney disease, stage 3 unspecified: Secondary | ICD-10-CM | POA: Diagnosis not present

## 2020-11-12 DIAGNOSIS — E039 Hypothyroidism, unspecified: Secondary | ICD-10-CM | POA: Diagnosis not present

## 2020-11-12 DIAGNOSIS — I10 Essential (primary) hypertension: Secondary | ICD-10-CM | POA: Diagnosis not present

## 2020-11-15 ENCOUNTER — Ambulatory Visit: Payer: Medicare Other | Admitting: Adult Health

## 2020-11-25 DIAGNOSIS — H2181 Floppy iris syndrome: Secondary | ICD-10-CM | POA: Diagnosis not present

## 2020-11-25 DIAGNOSIS — H26412 Soemmering's ring, left eye: Secondary | ICD-10-CM | POA: Diagnosis not present

## 2020-11-25 DIAGNOSIS — H3552 Pigmentary retinal dystrophy: Secondary | ICD-10-CM | POA: Diagnosis not present

## 2020-12-12 DIAGNOSIS — E039 Hypothyroidism, unspecified: Secondary | ICD-10-CM | POA: Diagnosis not present

## 2020-12-12 DIAGNOSIS — H9311 Tinnitus, right ear: Secondary | ICD-10-CM | POA: Diagnosis not present

## 2020-12-12 DIAGNOSIS — Z0000 Encounter for general adult medical examination without abnormal findings: Secondary | ICD-10-CM | POA: Diagnosis not present

## 2020-12-12 DIAGNOSIS — I10 Essential (primary) hypertension: Secondary | ICD-10-CM | POA: Diagnosis not present

## 2020-12-12 DIAGNOSIS — N183 Chronic kidney disease, stage 3 unspecified: Secondary | ICD-10-CM | POA: Diagnosis not present

## 2020-12-12 DIAGNOSIS — E785 Hyperlipidemia, unspecified: Secondary | ICD-10-CM | POA: Diagnosis not present

## 2020-12-24 DIAGNOSIS — M2669 Other specified disorders of temporomandibular joint: Secondary | ICD-10-CM | POA: Diagnosis not present

## 2020-12-24 DIAGNOSIS — K219 Gastro-esophageal reflux disease without esophagitis: Secondary | ICD-10-CM | POA: Diagnosis not present

## 2020-12-24 DIAGNOSIS — M542 Cervicalgia: Secondary | ICD-10-CM | POA: Diagnosis not present

## 2020-12-24 DIAGNOSIS — M4003 Postural kyphosis, cervicothoracic region: Secondary | ICD-10-CM | POA: Diagnosis not present

## 2020-12-24 DIAGNOSIS — J4520 Mild intermittent asthma, uncomplicated: Secondary | ICD-10-CM | POA: Diagnosis not present

## 2020-12-24 DIAGNOSIS — M2580 Other specified joint disorders, unspecified joint: Secondary | ICD-10-CM | POA: Diagnosis not present

## 2020-12-24 DIAGNOSIS — I10 Essential (primary) hypertension: Secondary | ICD-10-CM | POA: Diagnosis not present

## 2020-12-24 DIAGNOSIS — E039 Hypothyroidism, unspecified: Secondary | ICD-10-CM | POA: Diagnosis not present

## 2020-12-25 DIAGNOSIS — M2669 Other specified disorders of temporomandibular joint: Secondary | ICD-10-CM | POA: Diagnosis not present

## 2020-12-25 DIAGNOSIS — M2580 Other specified joint disorders, unspecified joint: Secondary | ICD-10-CM | POA: Diagnosis not present

## 2020-12-25 DIAGNOSIS — M4003 Postural kyphosis, cervicothoracic region: Secondary | ICD-10-CM | POA: Diagnosis not present

## 2020-12-25 DIAGNOSIS — M542 Cervicalgia: Secondary | ICD-10-CM | POA: Diagnosis not present

## 2020-12-30 DIAGNOSIS — M2669 Other specified disorders of temporomandibular joint: Secondary | ICD-10-CM | POA: Diagnosis not present

## 2020-12-30 DIAGNOSIS — M542 Cervicalgia: Secondary | ICD-10-CM | POA: Diagnosis not present

## 2020-12-30 DIAGNOSIS — M2580 Other specified joint disorders, unspecified joint: Secondary | ICD-10-CM | POA: Diagnosis not present

## 2020-12-30 DIAGNOSIS — M4003 Postural kyphosis, cervicothoracic region: Secondary | ICD-10-CM | POA: Diagnosis not present

## 2021-01-01 DIAGNOSIS — M542 Cervicalgia: Secondary | ICD-10-CM | POA: Diagnosis not present

## 2021-01-01 DIAGNOSIS — M4003 Postural kyphosis, cervicothoracic region: Secondary | ICD-10-CM | POA: Diagnosis not present

## 2021-01-01 DIAGNOSIS — M2669 Other specified disorders of temporomandibular joint: Secondary | ICD-10-CM | POA: Diagnosis not present

## 2021-01-01 DIAGNOSIS — M2580 Other specified joint disorders, unspecified joint: Secondary | ICD-10-CM | POA: Diagnosis not present

## 2021-01-03 DIAGNOSIS — M4003 Postural kyphosis, cervicothoracic region: Secondary | ICD-10-CM | POA: Diagnosis not present

## 2021-01-03 DIAGNOSIS — M2580 Other specified joint disorders, unspecified joint: Secondary | ICD-10-CM | POA: Diagnosis not present

## 2021-01-03 DIAGNOSIS — M2669 Other specified disorders of temporomandibular joint: Secondary | ICD-10-CM | POA: Diagnosis not present

## 2021-01-03 DIAGNOSIS — M542 Cervicalgia: Secondary | ICD-10-CM | POA: Diagnosis not present

## 2021-01-06 DIAGNOSIS — M2669 Other specified disorders of temporomandibular joint: Secondary | ICD-10-CM | POA: Diagnosis not present

## 2021-01-06 DIAGNOSIS — M542 Cervicalgia: Secondary | ICD-10-CM | POA: Diagnosis not present

## 2021-01-06 DIAGNOSIS — M4003 Postural kyphosis, cervicothoracic region: Secondary | ICD-10-CM | POA: Diagnosis not present

## 2021-01-06 DIAGNOSIS — M2580 Other specified joint disorders, unspecified joint: Secondary | ICD-10-CM | POA: Diagnosis not present

## 2021-01-07 ENCOUNTER — Other Ambulatory Visit: Payer: Self-pay

## 2021-01-07 ENCOUNTER — Ambulatory Visit: Payer: Medicare Other | Admitting: Cardiovascular Disease

## 2021-01-07 VITALS — BP 104/60 | HR 48 | Ht 67.0 in | Wt 193.0 lb

## 2021-01-07 DIAGNOSIS — E785 Hyperlipidemia, unspecified: Secondary | ICD-10-CM | POA: Diagnosis not present

## 2021-01-07 DIAGNOSIS — I739 Peripheral vascular disease, unspecified: Secondary | ICD-10-CM

## 2021-01-07 DIAGNOSIS — I10 Essential (primary) hypertension: Secondary | ICD-10-CM

## 2021-01-07 DIAGNOSIS — I471 Supraventricular tachycardia: Secondary | ICD-10-CM | POA: Diagnosis not present

## 2021-01-07 MED ORDER — VALSARTAN-HYDROCHLOROTHIAZIDE 160-12.5 MG PO TABS: 1.0000 | ORAL_TABLET | Freq: Every day | ORAL | 3 refills | Status: DC

## 2021-01-07 MED ORDER — ROSUVASTATIN CALCIUM 10 MG PO TABS: 10.0000 mg | ORAL_TABLET | Freq: Every day | ORAL | 3 refills | Status: DC

## 2021-01-07 NOTE — Progress Notes (Signed)
Cardiology Office Note   Date:  01/08/2021   ID:  Sheri Lopez, DOB 1955/06/12, MRN [id number]  PCP:  Carolee Rota, NP  Cardiologist: Dr. [name]  No chief complaint on file.     History of Present Illness: Sheri Lopez is a 65 y.o. female who is here today for follow-up visit regarding peripheral arterial disease.  She has known history of essential hypertension, PVCs, migraine, anxiety, atrial tachycardia bradycardia, legal blindness and breast cancer.  Previous cardiac work-up revealed normal LV systolic function. She is followed by Dr. Curt Bears for atrial tachycardia and she is currently on flecainide.  She does feel more tired and fatigued since she has been on the medication and she seems to be bradycardic today.  She is followed for peripheral arterial disease with abdominal bruits.  Noninvasive vascular studiesin 2021 with revealed normal ABI bilaterally.  Duplex showed no evidence of abdominal aortic aneurysm.  There was moderate bilateral external iliac artery disease with peak velocity of 271 on the left and 263 on the right. She is being treated medically given lack of symptoms related to peripheral arterial disease.  She denies claudication at this time.  She does report some dizziness and her blood pressure has been on the low side.  Past Medical History:  Diagnosis Date   Allergic rhinitis    Anxiety    Atrial tachycardia ([place]) 01/27/2017   Atypical nevus 03/27/2016   Left Mid Back-Moderate to Severe(w/s widershave)   Atypical nevus 08/31/2017   Right Wrist-Atypical Proliferation ([place])   Atypical nevus 04/12/2018   Right Upperarm-Atypical Proliferation ([place])   Bradycardia    Cancer ([facility])    breast   Dizziness    Essential hypertension 12/08/2016   Fatigue 12/08/2016   Frozen shoulder    Headache    Heart murmur    Hyperlipidemia 12/08/2016   Hypertension    Lower extremity edema 12/08/2016   Palpitations 12/08/2016   Psoriasis     Retinitis pigmentosa    Shortness of breath 12/08/2016   Thyroid disease    Tinnitus    Venous insufficiency     Past Surgical History:  Procedure Laterality Date   ABLATION ON ENDOMETRIOSIS     BREAST EXCISIONAL BIOPSY Right    fibrocystic cysts   BREAST EXCISIONAL BIOPSY Left 2013   cataracts     CESAREAN SECTION     x 1   eye surgeries     moles excised     x 2   NASAL SINUS SURGERY       Current Outpatient Medications  Medication Sig Dispense Refill   albuterol (PROVENTIL HFA;VENTOLIN HFA) 108 (90 Base) MCG/ACT inhaler Inhale 2 puffs into the lungs every 4 (four) hours as needed for shortness of breath. 1 Inhaler 2   aspirin EC 81 MG tablet Take 1 tablet (81 mg total) by mouth daily. Swallow whole. 90 tablet 3   AYR SALINE NASAL NA Nasal wash     cholecalciferol (VITAMIN D3) 25 MCG (1000 UNIT) tablet Take 2,000 Units by mouth daily.     clobetasol cream (TEMOVATE) [id number] % Apply 1 application topically as directed.     Cyanocobalamin (B-12 PO) Take 2 capsules by mouth daily.     famotidine (PEPCID) 20 MG tablet Take 20 mg by mouth 2 (two) times daily.     flecainide (TAMBOCOR) 50 MG tablet TAKE 1 TABLET(50 MG) BY MOUTH TWICE DAILY 180 tablet 3   ibuprofen (ADVIL,MOTRIN) 200 MG tablet Take 600 mg  by mouth every 6 (six) hours as needed (tooth pain).     levothyroxine (SYNTHROID, LEVOTHROID) 75 MCG tablet Take 75 mcg by mouth daily before breakfast.     meclizine (ANTIVERT) 25 MG tablet Take 25 mg by mouth 2 (two) times daily as needed.     rizatriptan (MAXALT-MLT) 10 MG disintegrating tablet Take 1 tablet (10 mg total) by mouth as needed for migraine. May repeat in 2 hours if needed 9 tablet 11   sertraline (ZOLOFT) 50 MG tablet Take 50 mg by mouth daily.     topiramate (TOPAMAX) 50 MG tablet Take 1 tablet (50 mg total) by mouth 2 (two) times daily. 60 tablet 11   valsartan-hydrochlorothiazide (DIOVAN-HCT) 160-12.5 MG tablet Take 1 tablet by mouth daily. 90 tablet 3    propranolol (INDERAL) 20 MG tablet Take 1 tablet (20 mg total) by mouth 2 (two) times daily. For palpitations/heart rates greater than 100 bpm. (Patient not taking: Reported on 01/07/2021) 60 tablet 5   rosuvastatin (CRESTOR) 10 MG tablet Take 1 tablet (10 mg total) by mouth daily. 90 tablet 3   No current facility-administered medications for this visit.    Allergies:   Beta adrenergic blockers, Topamax [topiramate], and Keflex [cephalexin]    Social History:  The patient  reports that she has never smoked. She has never used smokeless tobacco. She reports previous alcohol use. She reports that she does not use drugs.   Family History:  [name] history includes Arrhythmia in her mother; Breast cancer in her maternal aunt; CAD in her father; Cancer in her father and mother; Heart attack in her maternal grandfather and paternal grandfather; Heart failure in her paternal grandmother; Hyperlipidemia in her mother; Hypertension in her brother, mother, and sister.    ROS:  Please see the history of present illness.   Otherwise, review of systems are positive for none.   All other systems are reviewed and negative.    PHYSICAL EXAM: VS:  BP 104/60   Pulse (!) 48   Ht '5\' 7"'$  (1.702 m)   Wt 193 lb (87.5 kg)   SpO2 99%   BMI 30.23 kg/m  , BMI Body mass index is 30.23 kg/m. GEN: Well nourished, well developed, in no acute distress  HEENT: normal  Neck: no JVD, carotid bruits, or masses Cardiac: RRR; no murmurs, rubs, or gallops, mild bilateral leg edema  Respiratory:  clear to auscultation bilaterally, normal work of breathing GI: soft, nontender, nondistended, + BS MS: no deformity or atrophy  Skin: warm and dry, no rash Neuro:  Strength and sensation are intact Psych: euthymic mood, full affect Vascular: Femoral pulses slightly diminished bilaterally with bilateral bruits. The bruit is louder on the left side.    EKG:  EKG is not ordered today.    Recent  Labs: 03/19/2020: ALT 14    Lipid Panel    Component Value Date/Time   CHOL 153 03/19/2020 0827   TRIG 99 03/19/2020 0827   HDL 65 03/19/2020 0827   CHOLHDL 2.4 03/19/2020 0827   LDLCALC 70 03/19/2020 0827      Wt Readings from Last 3 Encounters:  01/07/21 193 lb (87.5 kg)  11/11/20 195 lb (88.5 kg)  09/02/20 191 lb (86.6 kg)       No flowsheet data found.    ASSESSMENT AND PLAN:  1. Peripheral arterial disease: The patient has evidence of moderate bilateral external iliac artery disease and she does have bilateral bruits.  Currently with no claudication.  Recommend continuing medical therapy. I encouraged her to stay active and do regular exercise.  2. Hyperlipidemia: I reviewed recent lipid profile done with her primary care physician which showed a triglyceride of 134 and LDL of 91.  Previous LDL was 61.  I refilled rosuvastatin and provided her with heart healthy diet instructions.  If we cannot get LDL below 70, we can uptitrate rosuvastatin.  3. Essential hypertension: Blood pressure has been on the low side and she complains of dizziness.  Thus, I decreased the dose of valsartan-hydrochlorothiazide by half.  4.  Atrial tachycardia: Currently on flecainide but he seems to be bradycardic and she feels more tired.  I encouraged her to discuss with Dr. Curt Bears.    Disposition:   FU with me in 12 months  Signed,  Kathlyn Sacramento, MD  01/08/2021 10:46 AM    [place]

## 2021-01-07 NOTE — Patient Instructions (Signed)
Medication Instructions:  DECREASE the Valsartan-Hydrochlorothiazide to 160-12.5 mg  *If you need a refill on your cardiac medications before your next appointment, please call your pharmacy*   Lab Work: None ordered If you have labs (blood work) drawn today and your tests are completely normal, you will receive your results only by: [place] (if you have MyChart) OR A paper copy in the mail If you have any lab test that is abnormal or we need to change your treatment, we will call you to review the results.   Testing/Procedures: None ordered   Follow-Up: At [facility], you and your health needs are our priority.  As part of our continuing mission to provide you with exceptional heart care, we have created designated Provider Care Teams.  These Care Teams include your primary Cardiologist (physician) and Advanced Practice Providers (APPs -  Physician Assistants and Nurse Practitioners) who all work together to provide you with the care you need, when you need it.  We recommend signing up for the patient portal called "MyChart".  Sign up information is provided on this After Visit Summary.  MyChart is used to connect with patients for Virtual Visits (Telemedicine).  Patients are able to view lab/test results, encounter notes, upcoming appointments, etc.  Non-urgent messages can be sent to your provider as well.   To learn more about what you can do with MyChart, go to [email or web address].    Your next appointment:   12 month(s)  The format for your next appointment:   In Person  Provider:   Dr. Fletcher Anon  Heart-Healthy Eating Plan Heart-healthy meal planning includes: Eating less unhealthy fats. Eating more healthy fats. Making other changes in your diet. Talk with your doctor or a diet specialist (dietitian) to create an eating plan that is right for you. What is my plan? Your doctor may recommend an eating plan that includes: Total fat: ______% or less of total  calories a day. Saturated fat: ______% or less of total calories a day. Cholesterol: less than _________mg a day. What are tips for following this plan? Cooking Avoid frying your food. Try to bake, boil, grill, or broil it instead. You can also reduce fat by: Removing the skin from poultry. Removing all visible fats from meats. Steaming vegetables in water or broth. Meal planning  At meals, divide your plate into four equal parts: Fill one-half of your plate with vegetables and green salads. Fill one-fourth of your plate with whole grains. Fill one-fourth of your plate with lean protein foods. Eat 4-5 servings of vegetables per day. A serving of vegetables is: 1 cup of raw or cooked vegetables. 2 cups of raw leafy greens. Eat 4-5 servings of fruit per day. A serving of fruit is: 1 medium whole fruit.  cup of dried fruit.  cup of fresh, frozen, or canned fruit.  cup of 100% fruit juice. Eat more foods that have soluble fiber. These are apples, broccoli, carrots, beans, peas, and barley. Try to get 20-30 g of fiber per day. Eat 4-5 servings of nuts, legumes, and seeds per week: 1 serving of dried beans or legumes equals  cup after being cooked. 1 serving of nuts is  cup. 1 serving of seeds equals 1 tablespoon.  General information Eat more home-cooked food. Eat less restaurant, buffet, and fast food. Limit or avoid alcohol. Limit foods that are high in starch and sugar. Avoid fried foods. Lose weight if you are overweight. Keep track of how much salt (sodium) you eat. This  is important if you have high blood pressure. Ask your doctor to tell you more about this. Try to add vegetarian meals each week. Fats Choose healthy fats. These include olive oil and canola oil, flaxseeds, walnuts, almonds, and seeds. Eat more omega-3 fats. These include salmon, mackerel, sardines, tuna, flaxseed oil, and ground flaxseeds. Try to eat fish at least 2 times each week. Check food labels.  Avoid foods with trans fats or high amounts of saturated fat. Limit saturated fats. These are often found in animal products, such as meats, butter, and cream. These are also found in plant foods, such as palm oil, palm kernel oil, and coconut oil. Avoid foods with partially hydrogenated oils in them. These have trans fats. Examples are stick margarine, some tub margarines, cookies, crackers, and other baked goods. What foods can I eat? Fruits All fresh, canned (in natural juice), or frozen fruits. Vegetables Fresh or frozen vegetables (raw, steamed, roasted, or grilled). Green salads. Grains Most grains. Choose whole wheat and whole grains most of the time. Rice andpasta, including brown rice and pastas made with whole wheat. Meats and other proteins Lean, well-trimmed beef, veal, pork, and lamb. Chicken and [place] without skin. All fish and shellfish. Wild duck, rabbit, pheasant, and venison. Egg whites or low-cholesterol egg substitutes. Dried beans, peas, lentils, and tofu. Seedsand most nuts. Dairy Low-fat or nonfat cheeses, including ricotta and mozzarella. Skim or 1% milk that is liquid, powdered, or evaporated. Buttermilk that is made with low-fatmilk. Nonfat or low-fat yogurt. Fats and oils Non-hydrogenated (trans-free) margarines. Vegetable oils, including soybean, sesame, sunflower, olive, peanut, safflower, corn, canola, and cottonseed. Salad dressings or mayonnaisemade with a vegetable oil. Beverages Mineral water. Coffee and tea. Diet carbonated beverages. Sweets and desserts Sherbet, gelatin, and fruit ice. Small amounts of dark chocolate. Limit all sweets and desserts. Seasonings and condiments All seasonings and condiments. The items listed above may not be a complete list of foods and drinks you can eat. Contact a dietitian for more options. What foods should I avoid? Fruits Canned fruit in heavy syrup. Fruit in cream or butter sauce. Fried fruit.  Limitcoconut. Vegetables Vegetables cooked in cheese, cream, or butter sauce. Fried vegetables. Grains Breads that are made with saturated or trans fats, oils, or whole milk. Croissants. Sweet rolls. Donuts. High-fat crackers,such as cheese crackers. Meats and other proteins Fatty meats, such as hot dogs, ribs, sausage, bacon, rib-eye roast or steak. High-fat deli meats, such as salami and bologna. Caviar. Domestic duck andgoose. Organ meats, such as liver. Dairy Cream, sour cream, cream cheese, and creamed cottage cheese. Whole-milk cheeses. Whole or 2% milk that is liquid, evaporated, or condensed. Whole buttermilk. Cream sauce or high-fat cheese sauce. Yogurt that is made fromwhole milk. Fats and oils Meat fat, or shortening. Cocoa butter, hydrogenated oils, palm oil, coconut oil, palm kernel oil. Solid fats and shortenings, including bacon fat, salt pork, lard, and butter. Nondairy cream substitutes. Salad dressings with cheeseor sour cream. Beverages Regular sodas and juice drinks with added sugar. Sweets and desserts Frosting. Pudding. Cookies. Cakes. Pies. Milk chocolate or white chocolate.Buttered syrups. Full-fat ice cream or ice cream drinks. The items listed above may not be a complete list of foods and drinks to avoid. Contact a dietitian for more information. Summary Heart-healthy meal planning includes eating less unhealthy fats, eating more healthy fats, and making other changes in your diet. Eat a balanced diet. This includes fruits and vegetables, low-fat or nonfat dairy, lean protein, nuts and legumes, whole grains, and  heart-healthy oils and fats. This information is not intended to replace advice given to you by your health care provider. Make sure you discuss any questions you have with your healthcare provider. Document Revised: 07/22/2017 Document Reviewed: 06/25/2017 Elsevier Patient Education  2022 [employer].

## 2021-01-08 DIAGNOSIS — M542 Cervicalgia: Secondary | ICD-10-CM | POA: Diagnosis not present

## 2021-01-08 DIAGNOSIS — M4003 Postural kyphosis, cervicothoracic region: Secondary | ICD-10-CM | POA: Diagnosis not present

## 2021-01-08 DIAGNOSIS — M2669 Other specified disorders of temporomandibular joint: Secondary | ICD-10-CM | POA: Diagnosis not present

## 2021-01-08 DIAGNOSIS — M2580 Other specified joint disorders, unspecified joint: Secondary | ICD-10-CM | POA: Diagnosis not present

## 2021-01-13 DIAGNOSIS — M2580 Other specified joint disorders, unspecified joint: Secondary | ICD-10-CM | POA: Diagnosis not present

## 2021-01-13 DIAGNOSIS — M4003 Postural kyphosis, cervicothoracic region: Secondary | ICD-10-CM | POA: Diagnosis not present

## 2021-01-13 DIAGNOSIS — M542 Cervicalgia: Secondary | ICD-10-CM | POA: Diagnosis not present

## 2021-01-13 DIAGNOSIS — M2669 Other specified disorders of temporomandibular joint: Secondary | ICD-10-CM | POA: Diagnosis not present

## 2021-01-15 DIAGNOSIS — M2669 Other specified disorders of temporomandibular joint: Secondary | ICD-10-CM | POA: Diagnosis not present

## 2021-01-15 DIAGNOSIS — M2580 Other specified joint disorders, unspecified joint: Secondary | ICD-10-CM | POA: Diagnosis not present

## 2021-01-15 DIAGNOSIS — M4003 Postural kyphosis, cervicothoracic region: Secondary | ICD-10-CM | POA: Diagnosis not present

## 2021-01-15 DIAGNOSIS — M542 Cervicalgia: Secondary | ICD-10-CM | POA: Diagnosis not present

## 2021-01-16 NOTE — Progress Notes (Signed)
Chief Complaint  Patient presents with   Follow-up    Rm 1, w husband. Here for 4 month migraine f/u, pt reports doing well. Pt has been in PT for 2 weeks for neck and TMJ.       HISTORY OF PRESENT ILLNESS: 01/20/21 ALL:  Sheri Lopez returns for follow up. She has continued topiramate '50mg'$  daily. She could not tolerate propranolol (reports low BP). She feels headaches are well managed. Cardiology changed BP meds. She is participating in PT that helps. She feels seasonal allergies contribute. She has not used rizatriptan. It does help when she needs it.   09/02/2020 ALL: Sheri Lopez is a 65 y.o. female here today for follow up for migraines. She was started on topiramate at consult with Dr Sheri Lopez in 12/2019. She called about a month later with concerns of sores in her mouth and throat and topiramate discontinued. Amovig started and worked well but she could afford to continue. She is also concerned that it caused leg pains, however, has vascular disease that could be contributed. She restarted topiramate 06/2020. Rizatriptan for abortive therapy. She feels that headaches are better. She may have 7-10 headaches a month. She has used rizatriptan twice in 6 months. It did work well for abortive therapy. Anxiety is a major trigger for her. She takes propranolol '10mg'$  as needed for palpitations managed with cardiology. She tolerates it well but could not tolerate metoprolol daily.    HISTORY (copied from Dr Gladstone Lighter previous note)  65 year old female here for evaluation of headaches.  History of retinitis pigmentosa, hypertension, hyperlipidemia, thyroid disease, psoriasis, breast cancer.   Patient had headaches with migraine features in the 1990s.  These went away spontaneously.  Since 2020 has recurrence of similar but worsening headaches recently with right eye, right temporal, right posterior auricular pain, throbbing sensation, nausea, sensitivity to light and sound, dizziness.  She feels  her heartbeat and throbbing sensation.  Triggers include allergies and fluorescent lights.  Headaches can last up to 3 days at a time.  Patient averaging up to 15 days of headache per month.  Sometimes she sees white spots and sparkles. Had MRI brain which was unremarkable in July 2020 for these symptoms.    Patient does have retinitis pigmentosa and has had multiple eye procedures and surgeries.   Patient has tried Excedrin Migraine with mild relief.  Family history of migraine in her daughter.    REVIEW OF SYSTEMS: Out of a complete 14 system review of symptoms, the patient complains only of the following symptoms, headaches, leg pain, seasonal allergies and all other reviewed systems are negative.    ALLERGIES: Allergies  Allergen Reactions   Beta Adrenergic Blockers     Lethargy, severe headaches   Topamax [Topiramate] Other (See Comments)    Severe heartburn, reflux   Keflex [Cephalexin] Hives     HOME MEDICATIONS: Outpatient Medications Prior to Visit  Medication Sig Dispense Refill   albuterol (PROVENTIL HFA;VENTOLIN HFA) 108 (90 Base) MCG/ACT inhaler Inhale 2 puffs into the lungs every 4 (four) hours as needed for shortness of breath. 1 Inhaler 2   aspirin EC 81 MG tablet Take 1 tablet (81 mg total) by mouth daily. Swallow whole. 90 tablet 3   AYR SALINE NASAL NA Nasal wash     cholecalciferol (VITAMIN D3) 25 MCG (1000 UNIT) tablet Take 2,000 Units by mouth daily.     clobetasol cream (TEMOVATE) [id number] % Apply 1 application topically as directed.     Cyanocobalamin (  B-12 PO) Take 2 capsules by mouth daily.     famotidine (PEPCID) 20 MG tablet Take 20 mg by mouth 2 (two) times daily.     flecainide (TAMBOCOR) 50 MG tablet TAKE 1 TABLET(50 MG) BY MOUTH TWICE DAILY (Patient taking differently: Take 25 mg by mouth 2 (two) times daily.) 180 tablet 3   ibuprofen (ADVIL,MOTRIN) 200 MG tablet Take 600 mg by mouth every 6 (six) hours as needed (tooth pain).     levothyroxine  (SYNTHROID, LEVOTHROID) 75 MCG tablet Take 75 mcg by mouth daily before breakfast.     meclizine (ANTIVERT) 25 MG tablet Take 25 mg by mouth 2 (two) times daily as needed.     rizatriptan (MAXALT-MLT) 10 MG disintegrating tablet Take 1 tablet (10 mg total) by mouth as needed for migraine. May repeat in 2 hours if needed 9 tablet 11   rosuvastatin (CRESTOR) 10 MG tablet Take 1 tablet (10 mg total) by mouth daily. 90 tablet 3   sertraline (ZOLOFT) 50 MG tablet Take 50 mg by mouth daily.     topiramate (TOPAMAX) 50 MG tablet Take 1 tablet (50 mg total) by mouth 2 (two) times daily. 60 tablet 11   valsartan-hydrochlorothiazide (DIOVAN-HCT) 160-12.5 MG tablet Take 1 tablet by mouth daily. 90 tablet 3   propranolol (INDERAL) 20 MG tablet Take 1 tablet (20 mg total) by mouth 2 (two) times daily. For palpitations/heart rates greater than 100 bpm. 60 tablet 5   No facility-administered medications prior to visit.     PAST MEDICAL HISTORY: Past Medical History:  Diagnosis Date   Allergic rhinitis    Anxiety    Atrial tachycardia ([place]) 01/27/2017   Atypical nevus 03/27/2016   Left Mid Back-Moderate to Severe(w/s widershave)   Atypical nevus 08/31/2017   Right Wrist-Atypical Proliferation ([place])   Atypical nevus 04/12/2018   Right Upperarm-Atypical Proliferation ([place])   Bradycardia    Cancer ([facility])    breast   Dizziness    Essential hypertension 12/08/2016   Fatigue 12/08/2016   Frozen shoulder    Headache    Heart murmur    Hyperlipidemia 12/08/2016   Hypertension    Lower extremity edema 12/08/2016   Palpitations 12/08/2016   Psoriasis    Retinitis pigmentosa    Shortness of breath 12/08/2016   Thyroid disease    Tinnitus    Venous insufficiency      PAST SURGICAL HISTORY: Past Surgical History:  Procedure Laterality Date   ABLATION ON ENDOMETRIOSIS     BREAST EXCISIONAL BIOPSY Right    fibrocystic cysts   BREAST EXCISIONAL BIOPSY Left 2013   cataracts     CESAREAN  SECTION     x 1   eye surgeries     moles excised     x 2   NASAL SINUS SURGERY       FAMILY HISTORY: Family History  Problem Relation Age of Onset   Breast cancer Maternal Aunt    Cancer Mother    Hyperlipidemia Mother    Hypertension Mother    Arrhythmia Mother    Cancer Father    CAD Father    Heart attack Maternal Grandfather    Heart failure Paternal Grandmother    Heart attack Paternal Grandfather    Hypertension Sister    Hypertension Brother      SOCIAL HISTORY: Social History   Socioeconomic History   Marital status: Married    Spouse name: Marden Noble   Number of children: 3   Years of  education: Not on file   Highest education level: Bachelor's degree (e.g., BA, AB, BS)  Occupational History   Not on file  Tobacco Use   Smoking status: Never   Smokeless tobacco: Never  Substance and Sexual Activity   Alcohol use: Not Currently   Drug use: Never   Sexual activity: Not on file  Other Topics Concern   Not on file  Social History Narrative   Lives with husband   Social Determinants of Health   Financial Resource Strain: Not on file  Food Insecurity: Not on file  Transportation Needs: Not on file  Physical Activity: Not on file  Stress: Not on file  Social Connections: Not on file  Intimate Partner Violence: Not on file      PHYSICAL EXAM  Vitals:   01/20/21 1137  BP: 111/73  Pulse: 64  Weight: 192 lb 12.8 oz (87.5 kg)  Height: 5' 7.5" (1.715 m)    Body mass index is 29.75 kg/m.   Generalized: Well developed, in no acute distress  Cardiology: normal rate and rhythm, no murmur auscultated  Respiratory: clear to auscultation bilaterally    Neurological examination  Mentation: Alert oriented to time, place, history taking. Follows all commands speech and language fluent Cranial nerve II-XII: Pupils not reactive, left irregular post surgery. Extraocular movements were full. Decreased visual acuity centrally. Facial sensation and strength  were normal. Head turning and shoulder shrug  were normal and symmetric. Motor: The motor testing reveals 5 over 5 strength of all 4 extremities. Good symmetric motor tone is noted throughout.  Gait and station: Gait is normal. Uses white cane.     DIAGNOSTIC DATA (LABS, IMAGING, TESTING) - I reviewed patient records, labs, notes, testing and imaging myself where available.  No results found for: WBC, HGB, HCT, MCV, PLT    Component Value Date/Time   NA 140 09/15/2019 1027   K 4.6 09/15/2019 1027   CL 99 09/15/2019 1027   CO2 25 09/15/2019 1027   GLUCOSE 87 09/15/2019 1027   BUN 19 09/15/2019 1027   CREATININE 0.91 09/15/2019 1027   CALCIUM 9.5 09/15/2019 1027   PROT 6.7 03/19/2020 0827   ALBUMIN 4.4 03/19/2020 0827   AST 19 03/19/2020 0827   ALT 14 03/19/2020 0827   ALKPHOS 55 03/19/2020 0827   BILITOT 0.4 03/19/2020 0827   GFRNONAA 67 09/15/2019 1027   GFRAA 77 09/15/2019 1027   Lab Results  Component Value Date   CHOL 153 03/19/2020   HDL 65 03/19/2020   LDLCALC 70 03/19/2020   TRIG 99 03/19/2020   CHOLHDL 2.4 03/19/2020   No results found for: HGBA1C No results found for: VITAMINB12 No results found for: TSH  No flowsheet data found.   No flowsheet data found.   ASSESSMENT AND PLAN  65 y.o. year old female  has a past medical history of Allergic rhinitis, Anxiety, Atrial tachycardia ([place]) (01/27/2017), Atypical nevus (03/27/2016), Atypical nevus (08/31/2017), Atypical nevus (04/12/2018), Bradycardia, Cancer ([place]), Dizziness, Essential hypertension (12/08/2016), Fatigue (12/08/2016), Frozen shoulder, Headache, Heart murmur, Hyperlipidemia (12/08/2016), Hypertension, Lower extremity edema (12/08/2016), Palpitations (12/08/2016), Psoriasis, Retinitis pigmentosa, Shortness of breath (12/08/2016), Thyroid disease, Tinnitus, and Venous insufficiency. here with   Migraine with aura and without status migrainosus, not intractable  Loveleen reports improvement in headache  frequency since starting topiramate '50mg'$  daily. We will continue. Unable to tolerate propranolol. She will continue rizatriptan as needed for migraine abortion. Healthy lifestyle habits encouraged. She will follow up in 1 year. She and her  husband verbalize understanding and agreement with this plan.   No orders of the defined types were placed in this encounter.    No orders of the defined types were placed in this encounter.    Debbora Presto, MSN, FNP-C 01/20/2021, 12:18 PM  [facility] [address], [place], [place] [address] [phone]

## 2021-01-16 NOTE — Patient Instructions (Signed)
Below is our plan:  We will continue topiramate '50mg'$  at bedtime. Continue rizatriptan as needed. Please take 1 tablet at onset of headache. May take 1 additional tablet in 2 hours if needed. Do not take more than 2 tablets in 24 hours or more than 10 in a month.   Please make sure you are staying well hydrated. I recommend 50-60 ounces daily. Well balanced diet and regular exercise encouraged. Consistent sleep schedule with 6-8 hours recommended.   Please continue follow up with care team as directed.   Follow up with me in 1 year   You may receive a survey regarding today's visit. I encourage you to leave honest feed back as I do use this information to improve patient care. Thank you for seeing me today!

## 2021-01-20 ENCOUNTER — Ambulatory Visit: Payer: Medicare Other | Admitting: Family Medicine

## 2021-01-20 ENCOUNTER — Encounter: Payer: Self-pay | Admitting: Family Medicine

## 2021-01-20 ENCOUNTER — Other Ambulatory Visit: Payer: Self-pay

## 2021-01-20 VITALS — BP 111/73 | HR 64 | Ht 67.5 in | Wt 192.8 lb

## 2021-01-20 DIAGNOSIS — M2669 Other specified disorders of temporomandibular joint: Secondary | ICD-10-CM | POA: Diagnosis not present

## 2021-01-20 DIAGNOSIS — M2580 Other specified joint disorders, unspecified joint: Secondary | ICD-10-CM | POA: Diagnosis not present

## 2021-01-20 DIAGNOSIS — M4003 Postural kyphosis, cervicothoracic region: Secondary | ICD-10-CM | POA: Diagnosis not present

## 2021-01-20 DIAGNOSIS — M542 Cervicalgia: Secondary | ICD-10-CM | POA: Diagnosis not present

## 2021-01-20 DIAGNOSIS — G43109 Migraine with aura, not intractable, without status migrainosus: Secondary | ICD-10-CM | POA: Diagnosis not present

## 2021-01-22 DIAGNOSIS — M4003 Postural kyphosis, cervicothoracic region: Secondary | ICD-10-CM | POA: Diagnosis not present

## 2021-01-22 DIAGNOSIS — M2580 Other specified joint disorders, unspecified joint: Secondary | ICD-10-CM | POA: Diagnosis not present

## 2021-01-22 DIAGNOSIS — M2669 Other specified disorders of temporomandibular joint: Secondary | ICD-10-CM | POA: Diagnosis not present

## 2021-01-22 DIAGNOSIS — M542 Cervicalgia: Secondary | ICD-10-CM | POA: Diagnosis not present

## 2021-02-18 DIAGNOSIS — E785 Hyperlipidemia, unspecified: Secondary | ICD-10-CM | POA: Diagnosis not present

## 2021-02-18 DIAGNOSIS — N183 Chronic kidney disease, stage 3 unspecified: Secondary | ICD-10-CM | POA: Diagnosis not present

## 2021-02-18 DIAGNOSIS — E039 Hypothyroidism, unspecified: Secondary | ICD-10-CM | POA: Diagnosis not present

## 2021-02-18 DIAGNOSIS — I10 Essential (primary) hypertension: Secondary | ICD-10-CM | POA: Diagnosis not present

## 2021-02-20 DIAGNOSIS — H9311 Tinnitus, right ear: Secondary | ICD-10-CM | POA: Diagnosis not present

## 2021-02-20 DIAGNOSIS — H8101 Meniere's disease, right ear: Secondary | ICD-10-CM | POA: Diagnosis not present

## 2021-02-20 DIAGNOSIS — H903 Sensorineural hearing loss, bilateral: Secondary | ICD-10-CM | POA: Diagnosis not present

## 2021-02-20 DIAGNOSIS — R42 Dizziness and giddiness: Secondary | ICD-10-CM | POA: Diagnosis not present

## 2021-02-27 ENCOUNTER — Encounter: Payer: Self-pay | Admitting: Family Medicine

## 2021-02-28 DIAGNOSIS — M542 Cervicalgia: Secondary | ICD-10-CM | POA: Diagnosis not present

## 2021-02-28 DIAGNOSIS — M2669 Other specified disorders of temporomandibular joint: Secondary | ICD-10-CM | POA: Diagnosis not present

## 2021-03-04 DIAGNOSIS — M2669 Other specified disorders of temporomandibular joint: Secondary | ICD-10-CM | POA: Diagnosis not present

## 2021-03-04 DIAGNOSIS — M2580 Other specified joint disorders, unspecified joint: Secondary | ICD-10-CM | POA: Diagnosis not present

## 2021-03-04 DIAGNOSIS — M4003 Postural kyphosis, cervicothoracic region: Secondary | ICD-10-CM | POA: Diagnosis not present

## 2021-03-04 DIAGNOSIS — M542 Cervicalgia: Secondary | ICD-10-CM | POA: Diagnosis not present

## 2021-03-07 DIAGNOSIS — M4003 Postural kyphosis, cervicothoracic region: Secondary | ICD-10-CM | POA: Diagnosis not present

## 2021-03-07 DIAGNOSIS — M542 Cervicalgia: Secondary | ICD-10-CM | POA: Diagnosis not present

## 2021-03-07 DIAGNOSIS — M2580 Other specified joint disorders, unspecified joint: Secondary | ICD-10-CM | POA: Diagnosis not present

## 2021-03-07 DIAGNOSIS — M2669 Other specified disorders of temporomandibular joint: Secondary | ICD-10-CM | POA: Diagnosis not present

## 2021-03-19 ENCOUNTER — Telehealth: Payer: Self-pay | Admitting: *Deleted

## 2021-03-19 NOTE — Telephone Encounter (Signed)
Hx AT/SVT, on Flecainide daily.  Pt reports low BPs/HRs. BP running low 100s/60s. HRs mid 40s resting. She reports no fluctuations with rhythm, taking Flecainide 25 mg BID.  She does NOT take Propranolol if/when needed as it "wipes me out, very lethargic, and can't do anything for day/two after taking it". She is experiencing fatigue and no energy. Aware will forward to MD for review/advisement.

## 2021-03-25 DIAGNOSIS — R42 Dizziness and giddiness: Secondary | ICD-10-CM | POA: Diagnosis not present

## 2021-03-25 DIAGNOSIS — H903 Sensorineural hearing loss, bilateral: Secondary | ICD-10-CM | POA: Diagnosis not present

## 2021-03-25 DIAGNOSIS — H8101 Meniere's disease, right ear: Secondary | ICD-10-CM | POA: Diagnosis not present

## 2021-06-17 DIAGNOSIS — H5203 Hypermetropia, bilateral: Secondary | ICD-10-CM | POA: Diagnosis not present

## 2021-06-22 DIAGNOSIS — E039 Hypothyroidism, unspecified: Secondary | ICD-10-CM | POA: Diagnosis not present

## 2021-06-22 DIAGNOSIS — E785 Hyperlipidemia, unspecified: Secondary | ICD-10-CM | POA: Diagnosis not present

## 2021-06-22 DIAGNOSIS — I10 Essential (primary) hypertension: Secondary | ICD-10-CM | POA: Diagnosis not present

## 2021-06-22 DIAGNOSIS — K219 Gastro-esophageal reflux disease without esophagitis: Secondary | ICD-10-CM | POA: Diagnosis not present

## 2021-06-23 DIAGNOSIS — E785 Hyperlipidemia, unspecified: Secondary | ICD-10-CM | POA: Diagnosis not present

## 2021-06-24 DIAGNOSIS — H8101 Meniere's disease, right ear: Secondary | ICD-10-CM | POA: Diagnosis not present

## 2021-06-24 DIAGNOSIS — H9311 Tinnitus, right ear: Secondary | ICD-10-CM | POA: Diagnosis not present

## 2021-06-24 DIAGNOSIS — H9041 Sensorineural hearing loss, unilateral, right ear, with unrestricted hearing on the contralateral side: Secondary | ICD-10-CM | POA: Diagnosis not present

## 2021-07-03 ENCOUNTER — Other Ambulatory Visit: Payer: Self-pay | Admitting: Cardiology

## 2021-08-08 DIAGNOSIS — H9041 Sensorineural hearing loss, unilateral, right ear, with unrestricted hearing on the contralateral side: Secondary | ICD-10-CM | POA: Diagnosis not present

## 2021-08-08 DIAGNOSIS — H8101 Meniere's disease, right ear: Secondary | ICD-10-CM | POA: Diagnosis not present

## 2021-08-08 DIAGNOSIS — R42 Dizziness and giddiness: Secondary | ICD-10-CM | POA: Diagnosis not present

## 2021-08-18 DIAGNOSIS — H9041 Sensorineural hearing loss, unilateral, right ear, with unrestricted hearing on the contralateral side: Secondary | ICD-10-CM | POA: Diagnosis not present

## 2021-08-29 DIAGNOSIS — I10 Essential (primary) hypertension: Secondary | ICD-10-CM | POA: Diagnosis not present

## 2021-08-29 DIAGNOSIS — N183 Chronic kidney disease, stage 3 unspecified: Secondary | ICD-10-CM | POA: Diagnosis not present

## 2021-08-29 DIAGNOSIS — E039 Hypothyroidism, unspecified: Secondary | ICD-10-CM | POA: Diagnosis not present

## 2021-08-29 DIAGNOSIS — E785 Hyperlipidemia, unspecified: Secondary | ICD-10-CM | POA: Diagnosis not present

## 2021-09-15 DIAGNOSIS — M2669 Other specified disorders of temporomandibular joint: Secondary | ICD-10-CM | POA: Diagnosis not present

## 2021-09-15 DIAGNOSIS — M4003 Postural kyphosis, cervicothoracic region: Secondary | ICD-10-CM | POA: Diagnosis not present

## 2021-09-15 DIAGNOSIS — M542 Cervicalgia: Secondary | ICD-10-CM | POA: Diagnosis not present

## 2021-09-15 DIAGNOSIS — M2580 Other specified joint disorders, unspecified joint: Secondary | ICD-10-CM | POA: Diagnosis not present

## 2021-09-22 DIAGNOSIS — M2580 Other specified joint disorders, unspecified joint: Secondary | ICD-10-CM | POA: Diagnosis not present

## 2021-09-22 DIAGNOSIS — M4003 Postural kyphosis, cervicothoracic region: Secondary | ICD-10-CM | POA: Diagnosis not present

## 2021-09-22 DIAGNOSIS — M542 Cervicalgia: Secondary | ICD-10-CM | POA: Diagnosis not present

## 2021-09-22 DIAGNOSIS — M2669 Other specified disorders of temporomandibular joint: Secondary | ICD-10-CM | POA: Diagnosis not present

## 2021-09-29 DIAGNOSIS — H9311 Tinnitus, right ear: Secondary | ICD-10-CM | POA: Diagnosis not present

## 2021-09-29 DIAGNOSIS — R42 Dizziness and giddiness: Secondary | ICD-10-CM | POA: Diagnosis not present

## 2021-09-29 DIAGNOSIS — H9041 Sensorineural hearing loss, unilateral, right ear, with unrestricted hearing on the contralateral side: Secondary | ICD-10-CM | POA: Diagnosis not present

## 2021-09-29 DIAGNOSIS — H9201 Otalgia, right ear: Secondary | ICD-10-CM | POA: Diagnosis not present

## 2021-10-06 ENCOUNTER — Other Ambulatory Visit: Payer: Self-pay | Admitting: Otolaryngology

## 2021-10-06 DIAGNOSIS — M2580 Other specified joint disorders, unspecified joint: Secondary | ICD-10-CM | POA: Diagnosis not present

## 2021-10-06 DIAGNOSIS — M2669 Other specified disorders of temporomandibular joint: Secondary | ICD-10-CM | POA: Diagnosis not present

## 2021-10-06 DIAGNOSIS — H9191 Unspecified hearing loss, right ear: Secondary | ICD-10-CM

## 2021-10-06 DIAGNOSIS — M4003 Postural kyphosis, cervicothoracic region: Secondary | ICD-10-CM | POA: Diagnosis not present

## 2021-10-06 DIAGNOSIS — M542 Cervicalgia: Secondary | ICD-10-CM | POA: Diagnosis not present

## 2021-10-09 DIAGNOSIS — H8101 Meniere's disease, right ear: Secondary | ICD-10-CM | POA: Diagnosis not present

## 2021-10-09 DIAGNOSIS — H9121 Sudden idiopathic hearing loss, right ear: Secondary | ICD-10-CM | POA: Diagnosis not present

## 2021-10-16 DIAGNOSIS — H9121 Sudden idiopathic hearing loss, right ear: Secondary | ICD-10-CM | POA: Diagnosis not present

## 2021-10-16 DIAGNOSIS — H8101 Meniere's disease, right ear: Secondary | ICD-10-CM | POA: Diagnosis not present

## 2021-10-23 DIAGNOSIS — H8101 Meniere's disease, right ear: Secondary | ICD-10-CM | POA: Diagnosis not present

## 2021-10-23 DIAGNOSIS — H9121 Sudden idiopathic hearing loss, right ear: Secondary | ICD-10-CM | POA: Diagnosis not present

## 2021-10-29 ENCOUNTER — Ambulatory Visit
Admission: RE | Admit: 2021-10-29 | Discharge: 2021-10-29 | Disposition: A | Discharge status: Home / Self Care | Payer: Medicare Other | Source: Ambulatory Visit | Attending: Otolaryngology | Admitting: Otolaryngology

## 2021-10-29 DIAGNOSIS — J3489 Other specified disorders of nose and nasal sinuses: Secondary | ICD-10-CM | POA: Diagnosis not present

## 2021-10-29 DIAGNOSIS — H9191 Unspecified hearing loss, right ear: Secondary | ICD-10-CM

## 2021-11-11 ENCOUNTER — Ambulatory Visit: Payer: Medicare Other | Admitting: Physician Assistant

## 2021-11-11 ENCOUNTER — Encounter: Payer: Self-pay | Admitting: Physician Assistant

## 2021-11-11 DIAGNOSIS — Z86018 Personal history of other benign neoplasm: Secondary | ICD-10-CM | POA: Diagnosis not present

## 2021-11-11 DIAGNOSIS — L720 Epidermal cyst: Secondary | ICD-10-CM

## 2021-11-11 DIAGNOSIS — Z1283 Encounter for screening for malignant neoplasm of skin: Secondary | ICD-10-CM | POA: Diagnosis not present

## 2021-11-12 ENCOUNTER — Other Ambulatory Visit: Payer: Self-pay

## 2021-11-12 ENCOUNTER — Encounter: Payer: Self-pay | Admitting: Physician Assistant

## 2021-11-12 MED ORDER — TOPIRAMATE 50 MG PO TABS: 50.0000 mg | ORAL_TABLET | Freq: Two times a day (BID) | ORAL | 3 refills | Status: DC

## 2021-11-12 NOTE — Progress Notes (Signed)
   Follow-Up Visit   Subjective  Sheri Lopez is a 66 y.o. female who presents for the following: Annual Exam (Patient here today for yearly skin check no concerns today. Personal history of atypical moles. No personal history of melanoma or non mole skin cancer. No family history of atypical moles, melanoma or non mole skin cancer. ).   The following portions of the chart were reviewed this encounter and updated as appropriate:  Tobacco  Allergies  Meds  Problems  Med Hx  Surg Hx  Fam Hx      Objective  Well appearing patient in no apparent distress; mood and affect are within normal limits.  A full examination was performed including scalp, head, eyes, ears, nose, lips, neck, chest, axillae, abdomen, back, buttocks, bilateral upper extremities, bilateral lower extremities, hands, feet, fingers, toes, fingernails, and toenails. All findings within normal limits unless otherwise noted below.  No atypical nevi or signs of NMSC noted at the time of the visit.   Mid Forehead White papule   Assessment & Plan  Encounter for screening for malignant neoplasm of skin  Yearly skin examination   [place] Mid Forehead  Ok to leave if stable    I, Frederick Klinger, PA-C, have reviewed all documentation's for this visit.  The documentation on 11/12/21 for the exam, diagnosis, procedures and orders are all accurate and complete.

## 2021-11-25 DIAGNOSIS — I10 Essential (primary) hypertension: Secondary | ICD-10-CM | POA: Diagnosis not present

## 2021-11-25 DIAGNOSIS — E039 Hypothyroidism, unspecified: Secondary | ICD-10-CM | POA: Diagnosis not present

## 2021-11-25 DIAGNOSIS — J4520 Mild intermittent asthma, uncomplicated: Secondary | ICD-10-CM | POA: Diagnosis not present

## 2021-11-25 DIAGNOSIS — E785 Hyperlipidemia, unspecified: Secondary | ICD-10-CM | POA: Diagnosis not present

## 2021-12-09 DIAGNOSIS — H8101 Meniere's disease, right ear: Secondary | ICD-10-CM | POA: Diagnosis not present

## 2021-12-09 DIAGNOSIS — H9311 Tinnitus, right ear: Secondary | ICD-10-CM | POA: Diagnosis not present

## 2021-12-09 DIAGNOSIS — H9041 Sensorineural hearing loss, unilateral, right ear, with unrestricted hearing on the contralateral side: Secondary | ICD-10-CM | POA: Diagnosis not present

## 2021-12-15 DIAGNOSIS — E039 Hypothyroidism, unspecified: Secondary | ICD-10-CM | POA: Diagnosis not present

## 2021-12-15 DIAGNOSIS — J4520 Mild intermittent asthma, uncomplicated: Secondary | ICD-10-CM | POA: Diagnosis not present

## 2021-12-15 DIAGNOSIS — Z0000 Encounter for general adult medical examination without abnormal findings: Secondary | ICD-10-CM | POA: Diagnosis not present

## 2021-12-15 DIAGNOSIS — E559 Vitamin D deficiency, unspecified: Secondary | ICD-10-CM | POA: Diagnosis not present

## 2021-12-15 DIAGNOSIS — I10 Essential (primary) hypertension: Secondary | ICD-10-CM | POA: Diagnosis not present

## 2022-01-09 ENCOUNTER — Other Ambulatory Visit: Payer: Self-pay | Admitting: Cardiovascular Disease

## 2022-01-09 ENCOUNTER — Telehealth: Payer: Self-pay | Admitting: Cardiovascular Disease

## 2022-01-09 MED ORDER — VALSARTAN-HYDROCHLOROTHIAZIDE 160-12.5 MG PO TABS: 1.0000 | ORAL_TABLET | Freq: Every day | ORAL | 1 refills | Status: DC

## 2022-01-09 MED ORDER — ROSUVASTATIN CALCIUM 10 MG PO TABS: 10.0000 mg | ORAL_TABLET | Freq: Every day | ORAL | 1 refills | Status: DC

## 2022-01-09 NOTE — Telephone Encounter (Signed)
*  STAT* If patient is at the pharmacy, call can be transferred to refill team.   1. Which medications need to be refilled? (please list name of each medication and dose if known)  rosuvastatin (CRESTOR) 10 MG tablet valsartan-hydrochlorothiazide (DIOVAN-HCT) 160-12.5 MG tablet  2. Which pharmacy/location (including street and city if local pharmacy) is medication to be sent to? [facility] [place], [place] AT [address]  3. Do they need a 30 day or 90 day supply? 90 day   Patient is completely out of medication. She has an appointment 9/12

## 2022-01-13 ENCOUNTER — Encounter: Payer: Self-pay | Admitting: Physician Assistant

## 2022-01-20 ENCOUNTER — Ambulatory Visit: Payer: Medicare Other | Admitting: Family Medicine

## 2022-01-20 ENCOUNTER — Encounter: Payer: Self-pay | Admitting: Family Medicine

## 2022-01-20 VITALS — BP 115/60 | HR 53 | Ht 67.5 in | Wt 177.5 lb

## 2022-01-20 DIAGNOSIS — G43109 Migraine with aura, not intractable, without status migrainosus: Secondary | ICD-10-CM

## 2022-01-20 MED ORDER — TOPIRAMATE 50 MG PO TABS: 50.0000 mg | ORAL_TABLET | Freq: Every day | ORAL | 3 refills | Status: DC

## 2022-01-20 NOTE — Patient Instructions (Signed)
Below is our plan:  We will continue topiramate '50mg'$  daily for now. You may wean dose by alternating '50mg'$  and '25mg'$  every other night or by taking '25mg'$  every night.   Please make sure you are staying well hydrated. I recommend 50-60 ounces daily. Well balanced diet and regular exercise encouraged. Consistent sleep schedule with 6-8 hours recommended.   Please continue follow up with care team as directed.   Follow up with me in 1 year   You may receive a survey regarding today's visit. I encourage you to leave honest feed back as I do use this information to improve patient care. Thank you for seeing me today!

## 2022-01-20 NOTE — Progress Notes (Signed)
Chief Complaint  Patient presents with   Follow-up    Rm 2, w husband. Here to f/u for migraine. Pt reports doing well today. No concerns.     HISTORY OF PRESENT ILLNESS:  01/20/22 ALL:  Sheri Lopez returns for follow up for headaches. She continues topiramate '50mg'$  QHS. She reports headaches are very well managed. She was diagnosed with Meniere's last fall. She has reduced salt intake and reports symptoms are significant improved. PT was very helpful. BP meds were advised and she has hearing aids. She is feeling well and without complaints.   01/20/2021 ALL: Sheri Lopez returns for follow up. She has continued topiramate '50mg'$  daily. She could not tolerate propranolol (reports low BP). She feels headaches are well managed. Cardiology changed BP meds. She is participating in PT that helps. She feels seasonal allergies contribute. She has not used rizatriptan. It does help when she needs it.   09/02/2020 ALL: Sheri Lopez is a 66 y.o. female here today for follow up for migraines. She was started on topiramate at consult with Dr Leta Baptist in 12/2019. She called about a month later with concerns of sores in her mouth and throat and topiramate discontinued. Amovig started and worked well but she could afford to continue. She is also concerned that it caused leg pains, however, has vascular disease that could be contributed. She restarted topiramate 06/2020. Rizatriptan for abortive therapy. She feels that headaches are better. She may have 7-10 headaches a month. She has used rizatriptan twice in 6 months. It did work well for abortive therapy. Anxiety is a major trigger for her. She takes propranolol '10mg'$  as needed for palpitations managed with cardiology. She tolerates it well but could not tolerate metoprolol daily.    HISTORY (copied from Dr Gladstone Lighter previous note)  66 year old female here for evaluation of headaches.  History of retinitis pigmentosa, hypertension, hyperlipidemia, thyroid disease,  psoriasis, breast cancer.   Patient had headaches with migraine features in the 1990s.  These went away spontaneously.  Since 2020 has recurrence of similar but worsening headaches recently with right eye, right temporal, right posterior auricular pain, throbbing sensation, nausea, sensitivity to light and sound, dizziness.  She feels her heartbeat and throbbing sensation.  Triggers include allergies and fluorescent lights.  Headaches can last up to 3 days at a time.  Patient averaging up to 15 days of headache per month.  Sometimes she sees white spots and sparkles. Had MRI brain which was unremarkable in July 2020 for these symptoms.    Patient does have retinitis pigmentosa and has had multiple eye procedures and surgeries.   Patient has tried Excedrin Migraine with mild relief.  Family history of migraine in her daughter.  REVIEW OF SYSTEMS: Out of a complete 14 system review of symptoms, the patient complains only of the following symptoms, headaches, leg pain, seasonal allergies and all other reviewed systems are negative.  ALLERGIES: Allergies  Allergen Reactions   Beta Adrenergic Blockers     Lethargy, severe headaches   Topamax [Topiramate] Other (See Comments)    Severe heartburn, reflux   Keflex [Cephalexin] Hives     HOME MEDICATIONS: Outpatient Medications Prior to Visit  Medication Sig Dispense Refill   albuterol (PROVENTIL HFA;VENTOLIN HFA) 108 (90 Base) MCG/ACT inhaler Inhale 2 puffs into the lungs every 4 (four) hours as needed for shortness of breath. 1 Inhaler 2   aspirin EC 81 MG tablet Take 1 tablet (81 mg total) by mouth daily. Swallow whole. 90 tablet 3  AYR SALINE NASAL NA Nasal wash     cholecalciferol (VITAMIN D3) 25 MCG (1000 UNIT) tablet Take 2,000 Units by mouth daily.     clobetasol cream (TEMOVATE) 7.61 % Apply 1 application topically as directed.     Cyanocobalamin (B-12 PO) Take 2 capsules by mouth daily.     famotidine (PEPCID) 20 MG tablet Take 20 mg  by mouth 2 (two) times daily.     flecainide (TAMBOCOR) 50 MG tablet TAKE 1 TABLET(50 MG) BY MOUTH TWICE DAILY 180 tablet 3   levothyroxine (SYNTHROID, LEVOTHROID) 75 MCG tablet Take 75 mcg by mouth daily before breakfast.     rizatriptan (MAXALT-MLT) 10 MG disintegrating tablet Take 1 tablet (10 mg total) by mouth as needed for migraine. May repeat in 2 hours if needed 9 tablet 11   rosuvastatin (CRESTOR) 10 MG tablet Take 1 tablet (10 mg total) by mouth daily. KEEP UPCOMING APPOINTMENT FOR FUTURE REFILLS. 30 tablet 1   sertraline (ZOLOFT) 50 MG tablet Take 50 mg by mouth daily.     valsartan-hydrochlorothiazide (DIOVAN-HCT) 160-12.5 MG tablet Take 1 tablet by mouth daily. KEEP UPCOMING APPOINTMENT FOR FUTURE REFILLS. 30 tablet 1   topiramate (TOPAMAX) 50 MG tablet Take 1 tablet (50 mg total) by mouth 2 (two) times daily. 60 tablet 3   ibuprofen (ADVIL,MOTRIN) 200 MG tablet Take 600 mg by mouth every 6 (six) hours as needed (tooth pain).     meclizine (ANTIVERT) 25 MG tablet Take 25 mg by mouth 2 (two) times daily as needed.     No facility-administered medications prior to visit.     PAST MEDICAL HISTORY: Past Medical History:  Diagnosis Date   Allergic rhinitis    Anxiety    Atrial tachycardia ([place]) 01/27/2017   Atypical nevus 03/27/2016   Left Mid Back-Moderate to Severe(w/s widershave)   Atypical nevus 08/31/2017   Right Wrist-Atypical Proliferation ([place])   Atypical nevus 04/12/2018   Right Upperarm-Atypical Proliferation ([place])   Bradycardia    Cancer ([facility])    breast   Dizziness    Essential hypertension 12/08/2016   Fatigue 12/08/2016   Frozen shoulder    Headache    Heart murmur    Hyperlipidemia 12/08/2016   Hypertension    Lower extremity edema 12/08/2016   Palpitations 12/08/2016   Psoriasis    Retinitis pigmentosa    Shortness of breath 12/08/2016   Thyroid disease    Tinnitus    Venous insufficiency      PAST SURGICAL HISTORY: Past Surgical History:   Procedure Laterality Date   ABLATION ON ENDOMETRIOSIS     BREAST EXCISIONAL BIOPSY Right    fibrocystic cysts   BREAST EXCISIONAL BIOPSY Left 2013   cataracts     CESAREAN SECTION     x 1   eye surgeries     moles excised     x 2   NASAL SINUS SURGERY       FAMILY HISTORY: Family History  Problem Relation Age of Onset   Breast cancer Maternal Aunt    Cancer Mother    Hyperlipidemia Mother    Hypertension Mother    Arrhythmia Mother    Cancer Father    CAD Father    Heart attack Maternal Grandfather    Heart failure Paternal Grandmother    Heart attack Paternal Grandfather    Hypertension Sister    Hypertension Brother      SOCIAL HISTORY: Social History   Socioeconomic History   Marital status: Married  Spouse name: Marden Noble   Number of children: 3   Years of education: Not on file   Highest education level: Bachelor's degree (e.g., BA, AB, BS)  Occupational History   Not on file  Tobacco Use   Smoking status: Never   Smokeless tobacco: Never  Substance and Sexual Activity   Alcohol use: Not Currently   Drug use: Never   Sexual activity: Not on file  Other Topics Concern   Not on file  Social History Narrative   Lives with husband   Social Determinants of Health   Financial Resource Strain: Not on file  Food Insecurity: Not on file  Transportation Needs: Not on file  Physical Activity: Not on file  Stress: Not on file  Social Connections: Not on file  Intimate Partner Violence: Not on file      PHYSICAL EXAM  Vitals:   01/20/22 1058  BP: 115/60  Pulse: (!) 53  Weight: 177 lb 8 oz (80.5 kg)  Height: 5' 7.5" (1.715 m)     Body mass index is 27.39 kg/m.   Generalized: Well developed, in no acute distress  Cardiology: normal rate and rhythm, no murmur auscultated  Respiratory: clear to auscultation bilaterally    Neurological examination  Mentation: Alert oriented to time, place, history taking. Follows all commands speech and  language fluent Cranial nerve II-XII: Pupils not reactive, left irregular post surgery. Extraocular movements were full. Decreased visual acuity centrally. Facial sensation and strength were normal. Head turning and shoulder shrug  were normal and symmetric. Motor: The motor testing reveals 5 over 5 strength of all 4 extremities. Good symmetric motor tone is noted throughout.  Gait and station: Gait is normal. Uses white cane.     DIAGNOSTIC DATA (LABS, IMAGING, TESTING) - I reviewed patient records, labs, notes, testing and imaging myself where available.  No results found for: "WBC", "HGB", "HCT", "MCV", "PLT"    Component Value Date/Time   NA 140 09/15/2019 1027   K 4.6 09/15/2019 1027   CL 99 09/15/2019 1027   CO2 25 09/15/2019 1027   GLUCOSE 87 09/15/2019 1027   BUN 19 09/15/2019 1027   CREATININE 0.91 09/15/2019 1027   CALCIUM 9.5 09/15/2019 1027   PROT 6.7 03/19/2020 0827   ALBUMIN 4.4 03/19/2020 0827   AST 19 03/19/2020 0827   ALT 14 03/19/2020 0827   ALKPHOS 55 03/19/2020 0827   BILITOT 0.4 03/19/2020 0827   GFRNONAA 67 09/15/2019 1027   GFRAA 77 09/15/2019 1027   Lab Results  Component Value Date   CHOL 153 03/19/2020   HDL 65 03/19/2020   LDLCALC 70 03/19/2020   TRIG 99 03/19/2020   CHOLHDL 2.4 03/19/2020   No results found for: "HGBA1C" No results found for: "VITAMINB12" No results found for: "TSH"      No data to display               No data to display           ASSESSMENT AND PLAN  66 y.o. year old female  has a past medical history of Allergic rhinitis, Anxiety, Atrial tachycardia ([place]) (01/27/2017), Atypical nevus (03/27/2016), Atypical nevus (08/31/2017), Atypical nevus (04/12/2018), Bradycardia, Cancer ([place]), Dizziness, Essential hypertension (12/08/2016), Fatigue (12/08/2016), Frozen shoulder, Headache, Heart murmur, Hyperlipidemia (12/08/2016), Hypertension, Lower extremity edema (12/08/2016), Palpitations (12/08/2016), Psoriasis, Retinitis  pigmentosa, Shortness of breath (12/08/2016), Thyroid disease, Tinnitus, and Venous insufficiency. here with   Migraine with aura and without status migrainosus, not intractable  Brittley reports  improvement in headache frequency since starting topiramate '50mg'$  daily. We will continue. May wean in the future if she wishes. She will continue rizatriptan as needed for migraine abortion. Healthy lifestyle habits encouraged. She will follow up in 1 year. She and her husband verbalize understanding and agreement with this plan.    No orders of the defined types were placed in this encounter.     Meds ordered this encounter  Medications   topiramate (TOPAMAX) 50 MG tablet    Sig: Take 1 tablet (50 mg total) by mouth daily.    Dispense:  90 tablet    Refill:  3    Order Specific Question:   Supervising Provider    Answer:   Melvenia Beam [[id number]]      Debbora Presto, MSN, FNP-C 01/20/2022, 11:28 AM  [facility] [address] [place], [place] [address] [phone]

## 2022-01-23 ENCOUNTER — Other Ambulatory Visit: Payer: Self-pay | Admitting: Family Medicine

## 2022-01-23 DIAGNOSIS — J4520 Mild intermittent asthma, uncomplicated: Secondary | ICD-10-CM | POA: Diagnosis not present

## 2022-01-23 DIAGNOSIS — E785 Hyperlipidemia, unspecified: Secondary | ICD-10-CM | POA: Diagnosis not present

## 2022-01-23 DIAGNOSIS — Z1231 Encounter for screening mammogram for malignant neoplasm of breast: Secondary | ICD-10-CM

## 2022-01-23 DIAGNOSIS — E039 Hypothyroidism, unspecified: Secondary | ICD-10-CM | POA: Diagnosis not present

## 2022-01-23 DIAGNOSIS — I10 Essential (primary) hypertension: Secondary | ICD-10-CM | POA: Diagnosis not present

## 2022-02-08 NOTE — Progress Notes (Unsigned)
PCP:  [place], [place] Primary Cardiologist: None Electrophysiologist: Will [name], MD   Sheri Lopez is a 66 y.o. female seen today for Will [name], MD for routine electrophysiology followup.  Since last being seen in our clinic the patient reports doing well overall.  She feels a little "washed out" in the mornings, but then is able to swim and exercise later in the day without issue. FitBit reports occasional HRs in the 40s, especially at night. she denies chest pain, palpitations, dyspnea, PND, orthopnea, nausea, vomiting, dizziness, syncope, edema, weight gain, or early satiety.  Past Medical History:  Diagnosis Date   Allergic rhinitis    Anxiety    Atrial tachycardia ([place]) 01/27/2017   Atypical nevus 03/27/2016   Left Mid Back-Moderate to Severe(w/s widershave)   Atypical nevus 08/31/2017   Right Wrist-Atypical Proliferation ([place])   Atypical nevus 04/12/2018   Right Upperarm-Atypical Proliferation ([place])   Bradycardia    Cancer (HCC)    breast   Dizziness    Essential hypertension 12/08/2016   Fatigue 12/08/2016   Frozen shoulder    Headache    Heart murmur    Hyperlipidemia 12/08/2016   Hypertension    Lower extremity edema 12/08/2016   Palpitations 12/08/2016   Psoriasis    Retinitis pigmentosa    Shortness of breath 12/08/2016   Thyroid disease    Tinnitus    Venous insufficiency    Past Surgical History:  Procedure Laterality Date   ABLATION ON ENDOMETRIOSIS     BREAST EXCISIONAL BIOPSY Right    fibrocystic cysts   BREAST EXCISIONAL BIOPSY Left 2013   cataracts     CESAREAN SECTION     x 1   eye surgeries     moles excised     x 2   NASAL SINUS SURGERY      Current Outpatient Medications  Medication Sig Dispense Refill   albuterol (PROVENTIL HFA;VENTOLIN HFA) 108 (90 Base) MCG/ACT inhaler Inhale 2 puffs into the lungs every 4 (four) hours as needed for shortness of breath. 1 Inhaler 2   aspirin EC 81 MG  tablet Take 1 tablet (81 mg total) by mouth daily. Swallow whole. 90 tablet 3   AYR SALINE NASAL NA Nasal wash     cholecalciferol (VITAMIN D3) 25 MCG (1000 UNIT) tablet Take 2,000 Units by mouth daily.     clobetasol cream (TEMOVATE) 7.82 % Apply 1 application topically as directed.     Cyanocobalamin (B-12 PO) Take 2 capsules by mouth daily.     famotidine (PEPCID) 20 MG tablet Take 20 mg by mouth 2 (two) times daily.     flecainide (TAMBOCOR) 50 MG tablet TAKE 1 TABLET(50 MG) BY MOUTH TWICE DAILY 180 tablet 3   levothyroxine (SYNTHROID, LEVOTHROID) 75 MCG tablet Take 75 mcg by mouth daily before breakfast.     rizatriptan (MAXALT-MLT) 10 MG disintegrating tablet Take 1 tablet (10 mg total) by mouth as needed for migraine. May repeat in 2 hours if needed 9 tablet 11   rosuvastatin (CRESTOR) 10 MG tablet Take 1 tablet (10 mg total) by mouth daily. KEEP UPCOMING APPOINTMENT FOR FUTURE REFILLS. 30 tablet 1   sertraline (ZOLOFT) 50 MG tablet Take 50 mg by mouth daily.     topiramate (TOPAMAX) 50 MG tablet Take 1 tablet (50 mg total) by mouth daily. 90 tablet 3   valsartan-hydrochlorothiazide (DIOVAN-HCT) 160-12.5 MG tablet Take 1 tablet by mouth daily. KEEP UPCOMING APPOINTMENT FOR FUTURE REFILLS.  30 tablet 1   No current facility-administered medications for this visit.    Allergies  Allergen Reactions   Beta Adrenergic Blockers     Lethargy, severe headaches   Topamax [Topiramate] Other (See Comments)    Severe heartburn, reflux   Keflex [Cephalexin] Hives    Social History   Socioeconomic History   Marital status: Married    Spouse name: Doug   Number of children: 3   Years of education: Not on file   Highest education level: Bachelor's degree (e.g., BA, AB, BS)  Occupational History   Not on file  Tobacco Use   Smoking status: Never   Smokeless tobacco: Never  Substance and Sexual Activity   Alcohol use: Not Currently   Drug use: Never   Sexual activity: Not on file   Other Topics Concern   Not on file  Social History Narrative   Lives with husband   Social Determinants of Health   Financial Resource Strain: Not on file  Food Insecurity: Not on file  Transportation Needs: Not on file  Physical Activity: Not on file  Stress: Not on file  Social Connections: Not on file  Intimate Partner Violence: Not on file     Review of Systems: All other systems reviewed and are otherwise negative except as noted above.  Physical Exam: There were no vitals filed for this visit.  GEN- The patient is well appearing, alert and oriented x 3 today.   HEENT: normocephalic, atraumatic; sclera clear, conjunctiva pink; hearing intact; oropharynx clear; neck supple, no JVP Lymph- no cervical lymphadenopathy Lungs- Clear to ausculation bilaterally, normal work of breathing.  No wheezes, rales, rhonchi Heart- Regular rate and rhythm, no murmurs, rubs or gallops, PMI not laterally displaced GI- soft, non-tender, non-distended, bowel sounds present, no hepatosplenomegaly Extremities- no clubbing, cyanosis, or edema; DP/PT/radial pulses 2+ bilaterally MS- no significant deformity or atrophy Skin- warm and dry, no rash or lesion Psych- euthymic mood, full affect Neuro- strength and sensation are intact  EKG is ordered. Personal review of EKG from today shows sinus bradycardia at 46 bpm  Additional studies reviewed include: Previous EP office notes.   Assessment and Plan:  1. Atrial tachycardia EKG today shows sinus bradycardia with stable PR interval on flecainide Feeling washed out in the am, but swimming late morning afternoon without issues.  Will have her wear a 7 day Zio XT to make sure she is not having any significant bradycardia. Reviewed with Dr. Curt Bears who agreed no clear indication to stop flecainide at this time.   2. HLD Continue statin   Follow up with Dr. Curt Bears in 2-3 months, sooner with abnormal work up.   Shirley Friar, PA-C   02/08/22 9:28 PM

## 2022-02-10 ENCOUNTER — Ambulatory Visit: Payer: Medicare Other | Admitting: Student

## 2022-02-10 ENCOUNTER — Encounter: Payer: Self-pay | Admitting: Student

## 2022-02-10 ENCOUNTER — Encounter: Payer: Self-pay | Admitting: Cardiovascular Disease

## 2022-02-10 ENCOUNTER — Ambulatory Visit (INDEPENDENT_AMBULATORY_CARE_PROVIDER_SITE_OTHER): Payer: Medicare Other

## 2022-02-10 ENCOUNTER — Ambulatory Visit: Payer: Medicare Other | Attending: Cardiovascular Disease | Admitting: Cardiovascular Disease

## 2022-02-10 VITALS — BP 120/77 | HR 51 | Ht 67.0 in | Wt 179.2 lb

## 2022-02-10 VITALS — BP 120/86 | HR 46 | Ht 67.0 in | Wt 180.0 lb

## 2022-02-10 DIAGNOSIS — I471 Supraventricular tachycardia: Secondary | ICD-10-CM | POA: Diagnosis not present

## 2022-02-10 DIAGNOSIS — E785 Hyperlipidemia, unspecified: Secondary | ICD-10-CM | POA: Diagnosis not present

## 2022-02-10 DIAGNOSIS — Z23 Encounter for immunization: Secondary | ICD-10-CM | POA: Diagnosis not present

## 2022-02-10 DIAGNOSIS — I739 Peripheral vascular disease, unspecified: Secondary | ICD-10-CM

## 2022-02-10 DIAGNOSIS — F411 Generalized anxiety disorder: Secondary | ICD-10-CM | POA: Diagnosis not present

## 2022-02-10 DIAGNOSIS — I10 Essential (primary) hypertension: Secondary | ICD-10-CM | POA: Diagnosis not present

## 2022-02-10 MED ORDER — ROSUVASTATIN CALCIUM 10 MG PO TABS: 10.0000 mg | ORAL_TABLET | Freq: Every day | ORAL | 3 refills | Status: DC

## 2022-02-10 MED ORDER — VALSARTAN-HYDROCHLOROTHIAZIDE 160-12.5 MG PO TABS: 1.0000 | ORAL_TABLET | Freq: Every day | ORAL | 3 refills | Status: DC

## 2022-02-10 NOTE — Patient Instructions (Signed)
Medication Instructions:  Your physician recommends that you continue on your current medications as directed. Please refer to the Current Medication list given to you today.  *If you need a refill on your cardiac medications before your next appointment, please call your pharmacy*   Lab Work: None If you have labs (blood work) drawn today and your tests are completely normal, you will receive your results only by: [place] (if you have MyChart) OR A paper copy in the mail If you have any lab test that is abnormal or we need to change your treatment, we will call you to review the results.   Follow-Up: At [facility], you and your health needs are our priority.  As part of our continuing mission to provide you with exceptional heart care, we have created designated Provider Care Teams.  These Care Teams include your primary Cardiologist (physician) and Advanced Practice Providers (APPs -  Physician Assistants and Nurse Practitioners) who all work together to provide you with the care you need, when you need it.   Your next appointment:   2 month(s)  The format for your next appointment:   In Person  Provider:   Allegra Lai, MD   Other Instructions:   [facility]- Long Term Monitor Instructions  Your physician has requested you wear a ZIO patch monitor for 7 days.  This is a single patch monitor. [employer] supplies one patch monitor per enrollment. Additional stickers are not available. Please do not apply patch if you will be having a Nuclear Stress Test,  Echocardiogram, Cardiac CT, MRI, or Chest Xray during the period you would be wearing the  monitor. The patch cannot be worn during these tests. You cannot remove and re-apply the  ZIO XT patch monitor.  Your ZIO patch monitor will be mailed 3 day USPS to your address on file. It may take 3-5 days  to receive your monitor after you have been enrolled.  Once you have received your monitor, please review the enclosed  instructions. Your monitor  has already been registered assigning a specific monitor serial # to you.  Billing and Patient Assistance Program Information  We have supplied [employer] with any of your insurance information on file for billing purposes. [employer] offers a sliding scale Patient Assistance Program for patients that do not have  insurance, or whose insurance does not completely cover the cost of the ZIO monitor.  You must apply for the Patient Assistance Program to qualify for this discounted rate.  To apply, please call [employer] at [phone], select option 4, select option 2, ask to apply for  Patient Assistance Program. Theodore Demark will ask your household income, and how many people  are in your household. They will quote your out-of-pocket cost based on that information.  [employer] will also be able to set up a 26-month interest-free payment plan if needed.  Applying the monitor   Shave hair from upper left chest.  Hold abrader disc by orange tab. Rub abrader in 40 strokes over the upper left chest as  indicated in your monitor instructions.  Clean area with 4 enclosed alcohol pads. Let dry.  Apply patch as indicated in monitor instructions. Patch will be placed under collarbone on left  side of chest with arrow pointing upward.  Rub patch adhesive wings for 2 minutes. Remove white label marked "1". Remove the white  label marked "2". Rub patch adhesive wings for 2 additional minutes.  While looking in a mirror, press and release button  in center of patch. A small green light will  flash 3-4 times. This will be your only indicator that the monitor has been turned on.  Do not shower for the first 24 hours. You may shower after the first 24 hours.  Press the button if you feel a symptom. You will hear a small click. Record Date, Time and  Symptom in the Patient Logbook.  When you are ready to remove the patch, follow instructions on the last 2 pages of Patient  Logbook. Stick patch  monitor onto the last page of Patient Logbook.  Place Patient Logbook in the blue and white box. Use locking tab on box and tape box closed  securely. The blue and white box has prepaid postage on it. Please place it in the mailbox as  soon as possible. Your physician should have your test results approximately 7 days after the  monitor has been mailed back to [facility].  Call [place] at [phone] if you have questions regarding  your ZIO XT patch monitor. Call them immediately if you see an orange light blinking on your  monitor.  If your monitor falls off in less than 4 days, contact our Monitor department at [phone].  If your monitor becomes loose or falls off after 4 days call [employer] at [phone] for  suggestions on securing your monitor

## 2022-02-10 NOTE — Progress Notes (Unsigned)
Enrolled for [employer] to mail a ZIO XT long term holter monitor to the patients address on file.   Dr. Camnitz to read. 

## 2022-02-10 NOTE — Progress Notes (Signed)
Cardiology Office Note   Date:  02/10/2022   ID:  Sheri Lopez, DOB 1955/07/26, MRN [id number]  PCP:  [facility], [place]  Cardiologist: Dr. Braelynne Garinger/Dr. Curt Bears  No chief complaint on file.      History of Present Illness: Sheri Lopez is a 66 y.o. female who is here today for follow-up visit regarding peripheral arterial disease.  She has known history of essential hypertension, PVCs, migraine, anxiety, atrial tachycardia , legal blindness and breast cancer.  Previous cardiac work-up revealed normal LV systolic function.  She is followed for peripheral arterial disease with abdominal bruits.  Noninvasive vascular studiesin 2021 with revealed normal ABI bilaterally.  Duplex showed no evidence of abdominal aortic aneurysm.  There was moderate bilateral external iliac artery disease with peak velocity of 271 on the left and 263 on the right. She is being treated medically given lack of symptoms related to peripheral arterial disease.  Last year, I cut down the dose of valsartan hydrochlorothiazide by half due to symptomatic hypotension.  Since then, she reports significant improvement.  No chest pain, shortness of breath or palpitations.  Occasional dizziness.  No significant leg claudication.   Past Medical History:  Diagnosis Date   Allergic rhinitis    Anxiety    Atrial tachycardia ([place]) 01/27/2017   Atypical nevus 03/27/2016   Left Mid Back-Moderate to Severe(w/s widershave)   Atypical nevus 08/31/2017   Right Wrist-Atypical Proliferation ([place])   Atypical nevus 04/12/2018   Right Upperarm-Atypical Proliferation ([place])   Bradycardia    Cancer (HCC)    breast   Dizziness    Essential hypertension 12/08/2016   Fatigue 12/08/2016   Frozen shoulder    Headache    Heart murmur    Hyperlipidemia 12/08/2016   Hypertension    Lower extremity edema 12/08/2016   Palpitations 12/08/2016   Psoriasis    Retinitis pigmentosa    Shortness of breath  12/08/2016   Thyroid disease    Tinnitus    Venous insufficiency     Past Surgical History:  Procedure Laterality Date   ABLATION ON ENDOMETRIOSIS     BREAST EXCISIONAL BIOPSY Right    fibrocystic cysts   BREAST EXCISIONAL BIOPSY Left 2013   cataracts     CESAREAN SECTION     x 1   eye surgeries     moles excised     x 2   NASAL SINUS SURGERY       Current Outpatient Medications  Medication Sig Dispense Refill   albuterol (PROVENTIL HFA;VENTOLIN HFA) 108 (90 Base) MCG/ACT inhaler Inhale 2 puffs into the lungs every 4 (four) hours as needed for shortness of breath. 1 Inhaler 2   aspirin EC 81 MG tablet Take 1 tablet (81 mg total) by mouth daily. Swallow whole. 90 tablet 3   AYR SALINE NASAL NA Nasal wash     cholecalciferol (VITAMIN D3) 25 MCG (1000 UNIT) tablet Take 2,000 Units by mouth daily.     clobetasol cream (TEMOVATE) 9.92 % Apply 1 application topically as directed.     Cyanocobalamin (B-12 PO) Take 2 capsules by mouth daily.     famotidine (PEPCID) 20 MG tablet Take 20 mg by mouth 2 (two) times daily.     flecainide (TAMBOCOR) 50 MG tablet TAKE 1 TABLET(50 MG) BY MOUTH TWICE DAILY (Patient taking differently: Per patient taking 1/2 tablet twice a day) 180 tablet 3   levothyroxine (SYNTHROID, LEVOTHROID) 75 MCG tablet Take 75 mcg by  mouth daily before breakfast.     rizatriptan (MAXALT-MLT) 10 MG disintegrating tablet Take 1 tablet (10 mg total) by mouth as needed for migraine. May repeat in 2 hours if needed 9 tablet 11   sertraline (ZOLOFT) 50 MG tablet Take 75 mg by mouth daily. TOTAL 75 MG     topiramate (TOPAMAX) 50 MG tablet Take 1 tablet (50 mg total) by mouth daily. (Patient taking differently: Take 25 mg by mouth daily. 25 MG TOTAL 1/2 TABLET AT NIGHT) 90 tablet 3   rosuvastatin (CRESTOR) 10 MG tablet Take 1 tablet (10 mg total) by mouth daily. 90 tablet 3   valsartan-hydrochlorothiazide (DIOVAN-HCT) 160-12.5 MG tablet Take 1 tablet by mouth daily. 90 tablet 3    No current facility-administered medications for this visit.    Allergies:   Beta adrenergic blockers, Topamax [topiramate], and Keflex [cephalexin]    Social History:  The patient  reports that she has never smoked. She has never used smokeless tobacco. She reports that she does not currently use alcohol. She reports that she does not use drugs.   Family History:  [name] history includes Arrhythmia in her mother; Breast cancer in her maternal aunt; CAD in her father; Cancer in her father and mother; Heart attack in her maternal grandfather and paternal grandfather; Heart failure in her paternal grandmother; Hyperlipidemia in her mother; Hypertension in her brother, mother, and sister.    ROS:  Please see the history of present illness.   Otherwise, review of systems are positive for none.   All other systems are reviewed and negative.    PHYSICAL EXAM: VS:  BP 120/77   Pulse (!) 51   Ht '5\' 7"'$  (1.702 m)   Wt 179 lb 3.2 oz (81.3 kg)   SpO2 98%   BMI 28.07 kg/m  , BMI Body mass index is 28.07 kg/m. GEN: Well nourished, well developed, in no acute distress  HEENT: normal  Neck: no JVD, carotid bruits, or masses Cardiac: RRR; no murmurs, rubs, or gallops, mild bilateral leg edema  Respiratory:  clear to auscultation bilaterally, normal work of breathing GI: soft, nontender, nondistended, + BS MS: no deformity or atrophy  Skin: warm and dry, no rash Neuro:  Strength and sensation are intact Psych: euthymic mood, full affect Vascular: Distal pedal pulses are palpable.   EKG:  EKG is ordered today. EKG showed sinus bradycardia with low voltage and poor R wave progression in the anterior leads.  Heart rate is 51 bpm.    Recent Labs: No results found for requested labs within last 365 days.    Lipid Panel    Component Value Date/Time   CHOL 153 03/19/2020 0827   TRIG 99 03/19/2020 0827   HDL 65 03/19/2020 0827   CHOLHDL 2.4 03/19/2020 0827   LDLCALC 70  03/19/2020 0827      Wt Readings from Last 3 Encounters:  02/10/22 180 lb (81.6 kg)  02/10/22 179 lb 3.2 oz (81.3 kg)  01/20/22 177 lb 8 oz (80.5 kg)           No data to display            ASSESSMENT AND PLAN:  1. Peripheral arterial disease: The patient has evidence of moderate bilateral external iliac artery disease and she does have bilateral bruits.  Currently with no claudication.  Recommend continuing medical therapy. Continue daily exercise and walking.  2. Hyperlipidemia: She improved her lifestyle since last visit and lost about 15 pounds.  In addition, she has been taking rosuvastatin 10 mg daily.  I reviewed most recent lipid profile done in January 2023 which showed an LDL of 67 and triglyceride of 127.  3. Essential hypertension: Blood pressures well controlled on current medications.  4.  Atrial tachycardia: Stable on flecainide.  She has a follow-up appointment with Dr. Curt Bears today.    Disposition:   FU with me in 12 months  Signed,  Kathlyn Sacramento, MD  02/10/2022 3:31 PM    [place]

## 2022-02-10 NOTE — Patient Instructions (Signed)
Medication Instructions:  Your physician recommends that you continue on your current medications as directed. Please refer to the Current Medication list given to you today.    Lab Work: None   Testing/Procedures: None   Follow-Up: At [facility], you and your health needs are our priority.  As part of our continuing mission to provide you with exceptional heart care, we have created designated Provider Care Teams.  These Care Teams include your primary Cardiologist (physician) and Advanced Practice Providers (APPs -  Physician Assistants and Nurse Practitioners) who all work together to provide you with the care you need, when you need it.  We recommend signing up for the patient portal called "MyChart".  Sign up information is provided on this After Visit Summary.  MyChart is used to connect with patients for Virtual Visits (Telemedicine).  Patients are able to view lab/test results, encounter notes, upcoming appointments, etc.  Non-urgent messages can be sent to your provider as well.   To learn more about what you can do with MyChart, go to [email or web address].    Your next appointment:   1 year(s)  The format for your next appointment:   In Person  Provider:   Kathlyn Sacramento, MD   Other Instructions   Important Information About Sugar

## 2022-02-13 ENCOUNTER — Ambulatory Visit
Admission: RE | Admit: 2022-02-13 | Discharge: 2022-02-13 | Disposition: A | Discharge status: Home / Self Care | Payer: Medicare Other | Source: Ambulatory Visit | Attending: Family Medicine | Admitting: Family Medicine

## 2022-02-13 DIAGNOSIS — Z1231 Encounter for screening mammogram for malignant neoplasm of breast: Secondary | ICD-10-CM

## 2022-02-13 DIAGNOSIS — I471 Supraventricular tachycardia: Secondary | ICD-10-CM | POA: Diagnosis not present

## 2022-02-23 DIAGNOSIS — I471 Supraventricular tachycardia: Secondary | ICD-10-CM | POA: Diagnosis not present

## 2022-04-10 ENCOUNTER — Encounter: Payer: Self-pay | Admitting: Family Medicine

## 2022-04-13 ENCOUNTER — Encounter: Payer: Self-pay | Admitting: Cardiology

## 2022-04-13 ENCOUNTER — Ambulatory Visit: Payer: Medicare Other | Attending: Cardiology | Admitting: Cardiology

## 2022-04-13 VITALS — BP 100/68 | HR 65 | Ht 67.0 in | Wt 177.0 lb

## 2022-04-13 DIAGNOSIS — I4719 Other supraventricular tachycardia: Secondary | ICD-10-CM

## 2022-04-13 DIAGNOSIS — I471 Supraventricular tachycardia, unspecified: Secondary | ICD-10-CM

## 2022-04-13 MED ORDER — HYDROCHLOROTHIAZIDE 12.5 MG PO CAPS: 12.5000 mg | ORAL_CAPSULE | Freq: Every day | ORAL | 6 refills | Status: DC

## 2022-04-13 NOTE — Progress Notes (Signed)
Electrophysiology Office Note   Date:  04/13/2022   ID:  Sheri Lopez, DOB 1955-09-28, MRN [id number]  PCP:  Larey Dresser Girtha Rm, FNP  Cardiologist:  [name] Primary Electrophysiologist:  Renate Danh [name], MD    No chief complaint on file.     History of Present Illness: Sheri Lopez is a 66 y.o. female who is being seen today for the evaluation of SVT at the request of [facility], [place]*. Presenting today for electrophysiology evaluation.   She has a history significant for hypertension, cardiac, legal blindness, and breast cancer.  She had an echo that showed normal systolic function and a Myoview with normal function and no ischemia.  She last saw PA Tillery 01/2022 describing feeling "washed out" in the mornings, but able to swim and exercise later in the day without issue. Her FitBit reportedly showed her HR was in the 40s at night. She recently wore a zio monitor for one week to eval.   She feels dizzy today, BP is a little low at 100/68. She had lunch and is well-hydrated. Her BP is usually in the 100s-110s at home, though she only checks it a couple times a week.   She recently stopped taking her topamax in an effort to reduce her pill burden. She had a very bad migraine last weekend and has decided to re-start the topamax with neurologist's input. Her BP during this migraine was in the 170s.   She has not felt palpitations in a while. She has been taking '50mg'$  flecainide daily (prescribed BID). She requests to reduce her prescriptions if possible.   Today, denies symptoms of palpitations, chest pain, shortness of breath, orthopnea, PND, lower extremity edema, claudication,  syncope, bleeding, or neurologic sequela. The patient is tolerating medications without difficulties.     Past Medical History:  Diagnosis Date   Allergic rhinitis    Anxiety    Atrial tachycardia 01/27/2017   Atypical nevus 03/27/2016   Left Mid Back-Moderate to Severe(w/s widershave)    Atypical nevus 08/31/2017   Right Wrist-Atypical Proliferation ([place])   Atypical nevus 04/12/2018   Right Upperarm-Atypical Proliferation ([place])   Bradycardia    Cancer (HCC)    breast   Dizziness    Essential hypertension 12/08/2016   Fatigue 12/08/2016   Frozen shoulder    Headache    Heart murmur    Hyperlipidemia 12/08/2016   Hypertension    Lower extremity edema 12/08/2016   Palpitations 12/08/2016   Psoriasis    Retinitis pigmentosa    Shortness of breath 12/08/2016   Thyroid disease    Tinnitus    Venous insufficiency    Past Surgical History:  Procedure Laterality Date   ABLATION ON ENDOMETRIOSIS     BREAST EXCISIONAL BIOPSY Right    fibrocystic cysts   BREAST EXCISIONAL BIOPSY Left 2013   cataracts     CESAREAN SECTION     x 1   eye surgeries     moles excised     x 2   NASAL SINUS SURGERY       Current Outpatient Medications  Medication Sig Dispense Refill   albuterol (PROVENTIL HFA;VENTOLIN HFA) 108 (90 Base) MCG/ACT inhaler Inhale 2 puffs into the lungs every 4 (four) hours as needed for shortness of breath. 1 Inhaler 2   aspirin EC 81 MG tablet Take 1 tablet (81 mg total) by mouth daily. Swallow whole. 90 tablet 3   cholecalciferol (VITAMIN D3) 25 MCG (1000 UNIT) tablet Take 2,000  Units by mouth daily.     clobetasol cream (TEMOVATE) 0.22 % Apply 1 application topically as directed.     Cyanocobalamin (B-12 PO) Take 2 capsules by mouth daily.     famotidine (PEPCID) 20 MG tablet Take 20 mg by mouth 2 (two) times daily.     flecainide (TAMBOCOR) 50 MG tablet TAKE 1 TABLET(50 MG) BY MOUTH TWICE DAILY (Patient taking differently: Take 50 mg by mouth daily.) 180 tablet 3   hydrochlorothiazide (MICROZIDE) 12.5 MG capsule Take 1 capsule (12.5 mg total) by mouth daily. 30 capsule 6   levothyroxine (SYNTHROID, LEVOTHROID) 75 MCG tablet Take 75 mcg by mouth daily before breakfast.     rizatriptan (MAXALT-MLT) 10 MG disintegrating tablet Take 1 tablet (10 mg  total) by mouth as needed for migraine. May repeat in 2 hours if needed 9 tablet 11   rosuvastatin (CRESTOR) 10 MG tablet Take 1 tablet (10 mg total) by mouth daily. 90 tablet 3   sertraline (ZOLOFT) 50 MG tablet Take 75 mg by mouth daily. TOTAL 75 MG     topiramate (TOPAMAX) 50 MG tablet Take 1 tablet (50 mg total) by mouth daily. (Patient taking differently: Take 25 mg by mouth daily. 25 MG TOTAL 1/2 TABLET AT NIGHT) 90 tablet 3   No current facility-administered medications for this visit.    Allergies:   Beta adrenergic blockers, Topamax [topiramate], and Keflex [cephalexin]   Social History:  The patient  reports that she has never smoked. She has never used smokeless tobacco. She reports that she does not currently use alcohol. She reports that she does not use drugs.   Family History:  [name] history includes Arrhythmia in her mother; Breast cancer in her maternal aunt; CAD in her father; Cancer in her father and mother; Heart attack in her maternal grandfather and paternal grandfather; Heart failure in her paternal grandmother; Hyperlipidemia in her mother; Hypertension in her brother, mother, and sister.   ROS:  Please see the history of present illness.   Otherwise, review of systems is positive for none.   All other systems are reviewed and negative.   PHYSICAL EXAM: VS:  BP 100/68   Pulse 65   Ht '5\' 7"'$  (1.702 m)   Wt 177 lb (80.3 kg)   LMP  (LMP Unknown)   SpO2 96%   BMI 27.72 kg/m  , BMI Body mass index is 27.72 kg/m. GEN: Well nourished, well developed, in no acute distress  HEENT: normal  Neck: no JVD, carotid bruits, or masses Cardiac: RRR; no murmurs, rubs, or gallops, 1+ edema BLL Respiratory:  clear to auscultation bilaterally, normal work of breathing GI: soft, nontender, nondistended, + BS MS: no deformity or atrophy  Skin: warm and dry Neuro:  Strength and sensation are intact Psych: euthymic mood, full affect  EKG:  EKG is ordered  today. Personal review of the ekg ordered shows sinus rhythm, rate 53  Recent Labs: No results found for requested labs within last 365 days.    Lipid Panel     Component Value Date/Time   CHOL 153 03/19/2020 0827   TRIG 99 03/19/2020 0827   HDL 65 03/19/2020 0827   CHOLHDL 2.4 03/19/2020 0827   LDLCALC 70 03/19/2020 0827     Wt Readings from Last 3 Encounters:  04/13/22 177 lb (80.3 kg)  02/10/22 180 lb (81.6 kg)  02/10/22 179 lb 3.2 oz (81.3 kg)      Other studies Reviewed: Additional studies/ records that were  reviewed today include:    Myocardial perfusion Imaging 07/29/2018  Nuclear stress EF: 62%. The left ventricular ejection fraction is normal (55-65%). There was no ST segment deviation noted during stress. This is a low risk study. No evidence of ischemia or previous infarction The study is normal.  TTE 12/21/16 - Left ventricle: The cavity size was normal. Systolic function was   normal. The estimated ejection fraction was in the range of 55%   to 60%. Wall motion was normal; there were no regional wall   motion abnormalities. Left ventricular diastolic function   parameters were normal.  Cardiac monitor 01/26/17 - personally reviewed Predominant rhythm: sinus rhythm, sinus bradycardia Average heart rate: 62 bpm   Sinus rhythm and sinus bradycardia   PACs, PVCs Atrial tachycardia  Cardiac monitor 02/23/2022 Predominant rhythm was sinus rhythm Less than 1% supraventricular ectopy 1.3% ventricular ectopy Triggered episodes associated with sinus rhythm and sinus tachycardia  ETT 02/17/17 Blood pressure demonstrated a normal response to exercise. Nonspecific ST changes noted during exercise. Immediate recovery there was 60m of horizontal ST segment depression in the inferolateral leads Abnormal exercise stress test in recovery. The patient had no symptoms during the stress test. Submaximal ETT with patient achieving only 74% maximum predicted heart rate.  The patient achieved 9.1 mets. Recommed pharmacolgic myocardial perfusion scan.  ASSESSMENT AND PLAN:  1.  Atrial tachycardia:  Has not felt palpitations in a long while She self-reduced flecainide dose to '50mg'$  daily (previously BID). She desires to reduce her number of prescriptions, if possible At this time, she prefers to focus on reducing BP meds given main symptom today is her dizziness. Can consider stopping flecainide if BP is well-controlled, but fatigue/dizziness continues.   2. Dizziness  Fatigue BP is a little soft in clinic at 100/68.  Discussed reducing diovan to only HCTZ 12.'5mg'$  daily.  Recommended she check BP more frequently. She Austen Oyster send updates via mychart. If BP increases, can increase HCTZ to '25mg'$  daily   2.  Hyperlipidemia: Continue Crestor per primary care  Current medicines are reviewed at length with the patient today.   The patient has concerns regarding her medicines.  The following changes were made today: stop diovan, start HCTZ 12.5    Labs/ tests ordered today include:  Orders Placed This Encounter  Procedures   EKG 12-Lead      Disposition:   FU with Francella Barnett 6 months  Signed, Ayona Yniguez MMeredith Leeds MD  04/13/2022 4:10 PM     [email or web address] [phone](office) [phone](fax)

## 2022-04-13 NOTE — Progress Notes (Signed)
Electrophysiology Office Note   Date:  04/13/2022   ID:  Sheri Lopez, DOB [birth year], MRN [id number]  PCP:  Larey Dresser Girtha Rm, FNP  Cardiologist:  [name] Primary Electrophysiologist:  Rendell Thivierge [name], MD    No chief complaint on file.     History of Present Illness: Sheri Lopez is a 66 y.o. female who is being seen today for the evaluation of SVT at the request of [place], [place]*. Presenting today for electrophysiology evaluation.   She has a history significant for hypertension, cardiac, legal blindness, and breast cancer.  She had an echo that showed normal systolic function and a Myoview with normal function and no ischemia.  She last saw PA Tillery 01/2022 describing feeling "washed out" in the mornings, but able to swim and exercise later in the day without issue. Her FitBit reportedly showed her HR was in the 40s at night. She recently wore a zio monitor for one week to eval.   She feels dizzy today, BP is a little low at 100/68. She had lunch and is well-hydrated. Her BP is usually in the 100s-110s at home, though she only checks it a couple times a week.   She recently stopped taking her topamax in an effort to reduce her pill burden. She had a very bad migraine last weekend and has decided to re-start the topamax with neurologist's input. Her BP during this migraine was in the 170s.   She has not felt palpitations in a while. She has been taking '50mg'$  flecainide daily (prescribed BID). She requests to reduce her prescriptions if possible.   Today, denies symptoms of palpitations, chest pain, shortness of breath, orthopnea, PND, lower extremity edema, claudication,  syncope, bleeding, or neurologic sequela. The patient is tolerating medications without difficulties.     Past Medical History:  Diagnosis Date   Allergic rhinitis    Anxiety    Atrial tachycardia 01/27/2017   Atypical nevus 03/27/2016   Left Mid Back-Moderate to Severe(w/s widershave)    Atypical nevus 08/31/2017   Right Wrist-Atypical Proliferation (Excision)   Atypical nevus 04/12/2018   Right Upperarm-Atypical Proliferation (Excision)   Bradycardia    Cancer (HCC)    breast   Dizziness    Essential hypertension 12/08/2016   Fatigue 12/08/2016   Frozen shoulder    Headache    Heart murmur    Hyperlipidemia 12/08/2016   Hypertension    Lower extremity edema 12/08/2016   Palpitations 12/08/2016   Psoriasis    Retinitis pigmentosa    Shortness of breath 12/08/2016   Thyroid disease    Tinnitus    Venous insufficiency    Past Surgical History:  Procedure Laterality Date   ABLATION ON ENDOMETRIOSIS     BREAST EXCISIONAL BIOPSY Right    fibrocystic cysts   BREAST EXCISIONAL BIOPSY Left 2013   cataracts     CESAREAN SECTION     x 1   eye surgeries     moles excised     x 2   NASAL SINUS SURGERY       Current Outpatient Medications  Medication Sig Dispense Refill   albuterol (PROVENTIL HFA;VENTOLIN HFA) 108 (90 Base) MCG/ACT inhaler Inhale 2 puffs into the lungs every 4 (four) hours as needed for shortness of breath. 1 Inhaler 2   aspirin EC 81 MG tablet Take 1 tablet (81 mg total) by mouth daily. Swallow whole. 90 tablet 3   cholecalciferol (VITAMIN D3) 25 MCG (1000 UNIT) tablet Take 2,000  Units by mouth daily.     clobetasol cream (TEMOVATE) 1.85 % Apply 1 application topically as directed.     Cyanocobalamin (B-12 PO) Take 2 capsules by mouth daily.     famotidine (PEPCID) 20 MG tablet Take 20 mg by mouth 2 (two) times daily.     flecainide (TAMBOCOR) 50 MG tablet TAKE 1 TABLET(50 MG) BY MOUTH TWICE DAILY (Patient taking differently: Take 50 mg by mouth daily.) 180 tablet 3   hydrochlorothiazide (MICROZIDE) 12.5 MG capsule Take 1 capsule (12.5 mg total) by mouth daily. 30 capsule 6   levothyroxine (SYNTHROID, LEVOTHROID) 75 MCG tablet Take 75 mcg by mouth daily before breakfast.     rizatriptan (MAXALT-MLT) 10 MG disintegrating tablet Take 1 tablet (10 mg  total) by mouth as needed for migraine. May repeat in 2 hours if needed 9 tablet 11   rosuvastatin (CRESTOR) 10 MG tablet Take 1 tablet (10 mg total) by mouth daily. 90 tablet 3   sertraline (ZOLOFT) 50 MG tablet Take 75 mg by mouth daily. TOTAL 75 MG     topiramate (TOPAMAX) 50 MG tablet Take 1 tablet (50 mg total) by mouth daily. (Patient taking differently: Take 25 mg by mouth daily. 25 MG TOTAL 1/2 TABLET AT NIGHT) 90 tablet 3   No current facility-administered medications for this visit.    Allergies:   Beta adrenergic blockers, Topamax [topiramate], and Keflex [cephalexin]   Social History:  The patient  reports that she has never smoked. She has never used smokeless tobacco. She reports that she does not currently use alcohol. She reports that she does not use drugs.   Family History:  [name] history includes Arrhythmia in her mother; Breast cancer in her maternal aunt; CAD in her father; Cancer in her father and mother; Heart attack in her maternal grandfather and paternal grandfather; Heart failure in her paternal grandmother; Hyperlipidemia in her mother; Hypertension in her brother, mother, and sister.   ROS:  Please see the history of present illness.   Otherwise, review of systems is positive for none.   All other systems are reviewed and negative.   PHYSICAL EXAM: VS:  BP 100/68   Pulse 65   Ht '5\' 7"'$  (1.702 m)   Wt 177 lb (80.3 kg)   LMP  (LMP Unknown)   SpO2 96%   BMI 27.72 kg/m  , BMI Body mass index is 27.72 kg/m. GEN: Well nourished, well developed, in no acute distress  HEENT: normal  Neck: no JVD, carotid bruits, or masses Cardiac: RRR; no murmurs, rubs, or gallops, 1+ edema BLL Respiratory:  clear to auscultation bilaterally, normal work of breathing GI: soft, nontender, nondistended, + BS MS: no deformity or atrophy  Skin: warm and dry Neuro:  Strength and sensation are intact Psych: euthymic mood, full affect  EKG:  EKG is ordered  today. Personal review of the ekg ordered shows sinus rhythm, rate 53  Recent Labs: No results found for requested labs within last 365 days.    Lipid Panel     Component Value Date/Time   CHOL 153 03/19/2020 0827   TRIG 99 03/19/2020 0827   HDL 65 03/19/2020 0827   CHOLHDL 2.4 03/19/2020 0827   LDLCALC 70 03/19/2020 0827     Wt Readings from Last 3 Encounters:  04/13/22 177 lb (80.3 kg)  02/10/22 180 lb (81.6 kg)  02/10/22 179 lb 3.2 oz (81.3 kg)      Other studies Reviewed: Additional studies/ records that were  reviewed today include:    Myocardial perfusion Imaging 07/29/2018  Nuclear stress EF: 62%. The left ventricular ejection fraction is normal (55-65%). There was no ST segment deviation noted during stress. This is a low risk study. No evidence of ischemia or previous infarction The study is normal.  TTE 12/21/16 - Left ventricle: The cavity size was normal. Systolic function was   normal. The estimated ejection fraction was in the range of 55%   to 60%. Wall motion was normal; there were no regional wall   motion abnormalities. Left ventricular diastolic function   parameters were normal.  Cardiac monitor 01/26/17 - personally reviewed Predominant rhythm: sinus rhythm, sinus bradycardia Average heart rate: 62 bpm   Sinus rhythm and sinus bradycardia   PACs, PVCs Atrial tachycardia  Cardiac monitor 02/23/2022 Predominant rhythm was sinus rhythm Less than 1% supraventricular ectopy 1.3% ventricular ectopy Triggered episodes associated with sinus rhythm and sinus tachycardia  ETT 02/17/17 Blood pressure demonstrated a normal response to exercise. Nonspecific ST changes noted during exercise. Immediate recovery there was 47m of horizontal ST segment depression in the inferolateral leads Abnormal exercise stress test in recovery. The patient had no symptoms during the stress test. Submaximal ETT with patient achieving only 74% maximum predicted heart rate.  The patient achieved 9.1 mets. Recommed pharmacolgic myocardial perfusion scan.  ASSESSMENT AND PLAN:  1.  Atrial tachycardia:  Has not felt palpitations in a long while She self-reduced flecainide dose to '50mg'$  daily (previously BID). She desires to reduce her number of prescriptions, if possible At this time, she prefers to focus on reducing BP meds given main symptom today is her dizziness. Can consider stopping flecainide if BP is well-controlled, but fatigue/dizziness continues.   2. Dizziness  Fatigue BP is a little soft in clinic at 100/68.  Discussed reducing diovan to only HCTZ 12.'5mg'$  daily.  Recommended she check BP more frequently. She Yaiza Palazzola send updates via mychart. If BP increases, can increase HCTZ to '25mg'$  daily   2.  Hyperlipidemia: Continue Crestor per primary care  Current medicines are reviewed at length with the patient today.   The patient has concerns regarding her medicines.  The following changes were made today: stop diovan, start HCTZ 12.5    Labs/ tests ordered today include:  Orders Placed This Encounter  Procedures   EKG 12-Lead      Disposition:   FU with Tasmine Hipwell 6 months  Signed, Kaislyn Gulas MMeredith Leeds MD  04/13/2022 4:12 PM     [phone](office) ([phone](fax)   I have seen and examined this patient with SMamie Levers  Agree with above, note added to reflect my findings.  She presents today with continued episodes of feeling lightheaded and dizzy.  She has no chest pain, but has been a little bit more lightheaded today.  She recently held her Topamax.  She continued to have headaches and noted significantly elevated blood pressures last week.  Since restarting her Topamax, her blood pressure has been better controlled and her headaches also been better controlled.  She does state that she feels lightheaded today with low blood pressures.  GEN: Well  nourished, well developed, in no acute distress  HEENT: normal  Neck: no JVD, carotid bruits, or masses Cardiac: RRR; no murmurs, rubs, or gallops,no edema  Respiratory:  clear to auscultation bilaterally, normal work of breathing GI: soft, nontender, nondistended, + BS MS: no deformity or atrophy  Skin: warm and  dry Neuro:  Strength and sensation are intact Psych: euthymic mood, full affect   Atrial tachycardia: She has not felt symptoms for quite some time.  She is currently on flecainide 50 mg daily.  I told her that due to her symptoms of fatigue, we could try stopping her flecainide to see if this Anjenette Gerbino help her.  She would like to consider this and Ciji Boston let us know. Hypertension: Patient is on Diovan.  Her blood pressure is low today.  She feels somewhat washed out and fatigued.  We Lindamarie Maclachlan stop her Diovan and start her on HCTZ 12.5 mg daily.  If her blood pressure becomes more elevated, Saul Dorsi potentially increase to 25 mg. Shatoria Stooksbury M. Kayvan Hoefling MD 04/13/2022 4:12 PM

## 2022-04-13 NOTE — Patient Instructions (Addendum)
Medication Instructions:  Your physician has recommended you make the following change in your medication:  STOP Diovan START Hydrochlorothiazide 12.5 mg once daily    *If you need a refill on your cardiac medications before your next appointment, please call your pharmacy*   Lab Work: None ordered   Testing/Procedures: None ordered   Follow-Up: At [facility], you and your health needs are our priority.  As part of our continuing mission to provide you with exceptional heart care, we have created designated Provider Care Teams.  These Care Teams include your primary Cardiologist (physician) and Advanced Practice Providers (APPs -  Physician Assistants and Nurse Practitioners) who all work together to provide you with the care you need, when you need it.  Your next appointment:   6 month(s)  The format for your next appointment:   In Person  Provider:   Allegra Lai, MD    Thank you for choosing [facility]!!   Trinidad Curet, RN [phone]  Other Instructions   Important Information About Sugar

## 2022-04-30 DIAGNOSIS — I10 Essential (primary) hypertension: Secondary | ICD-10-CM | POA: Diagnosis not present

## 2022-04-30 DIAGNOSIS — N1831 Chronic kidney disease, stage 3a: Secondary | ICD-10-CM | POA: Diagnosis not present

## 2022-04-30 DIAGNOSIS — J4520 Mild intermittent asthma, uncomplicated: Secondary | ICD-10-CM | POA: Diagnosis not present

## 2022-04-30 DIAGNOSIS — E785 Hyperlipidemia, unspecified: Secondary | ICD-10-CM | POA: Diagnosis not present

## 2022-06-11 DIAGNOSIS — Z03818 Encounter for observation for suspected exposure to other biological agents ruled out: Secondary | ICD-10-CM | POA: Diagnosis not present

## 2022-06-11 DIAGNOSIS — J069 Acute upper respiratory infection, unspecified: Secondary | ICD-10-CM | POA: Diagnosis not present

## 2022-06-11 DIAGNOSIS — R051 Acute cough: Secondary | ICD-10-CM | POA: Diagnosis not present

## 2022-06-11 DIAGNOSIS — F411 Generalized anxiety disorder: Secondary | ICD-10-CM | POA: Diagnosis not present

## 2022-06-24 DIAGNOSIS — H9041 Sensorineural hearing loss, unilateral, right ear, with unrestricted hearing on the contralateral side: Secondary | ICD-10-CM | POA: Diagnosis not present

## 2022-06-24 DIAGNOSIS — H8101 Meniere's disease, right ear: Secondary | ICD-10-CM | POA: Diagnosis not present

## 2022-07-07 DIAGNOSIS — H5203 Hypermetropia, bilateral: Secondary | ICD-10-CM | POA: Diagnosis not present

## 2022-07-08 DIAGNOSIS — H524 Presbyopia: Secondary | ICD-10-CM | POA: Diagnosis not present

## 2022-07-12 ENCOUNTER — Encounter: Payer: Self-pay | Admitting: Cardiology

## 2022-07-15 NOTE — Telephone Encounter (Signed)
Apologized to pt that someone had not followed up with her yet on  this matter. She reports edema has "gone down", but still present.  Her ankles "are quite swollen" but still improved.  When it was worse they would become uncomfortable by the end of her day. She reports SBPs in the 110s when she sits for prolonged periods. Unsure if swelling is pitting or not as she is out walking and says she cannot assess that at this time, but she will send mychart message to Dr. Fletcher Anon once she is able. Aware that this should go through Dr. Fletcher Anon. Aware forwarding to him and his nurse to address. Pt is agreeable to plan.

## 2022-07-20 MED ORDER — HYDROCHLOROTHIAZIDE 12.5 MG PO CAPS: 12.5000 mg | ORAL_CAPSULE | Freq: Every day | ORAL | 1 refills | Status: DC

## 2022-07-21 DIAGNOSIS — L538 Other specified erythematous conditions: Secondary | ICD-10-CM | POA: Diagnosis not present

## 2022-07-21 DIAGNOSIS — L728 Other follicular cysts of the skin and subcutaneous tissue: Secondary | ICD-10-CM | POA: Diagnosis not present

## 2022-07-21 DIAGNOSIS — Z789 Other specified health status: Secondary | ICD-10-CM | POA: Diagnosis not present

## 2022-07-21 DIAGNOSIS — L814 Other melanin hyperpigmentation: Secondary | ICD-10-CM | POA: Diagnosis not present

## 2022-07-21 DIAGNOSIS — L820 Inflamed seborrheic keratosis: Secondary | ICD-10-CM | POA: Diagnosis not present

## 2022-07-21 DIAGNOSIS — D225 Melanocytic nevi of trunk: Secondary | ICD-10-CM | POA: Diagnosis not present

## 2022-07-21 DIAGNOSIS — L821 Other seborrheic keratosis: Secondary | ICD-10-CM | POA: Diagnosis not present

## 2022-07-21 DIAGNOSIS — R208 Other disturbances of skin sensation: Secondary | ICD-10-CM | POA: Diagnosis not present

## 2022-09-03 ENCOUNTER — Other Ambulatory Visit: Payer: Self-pay | Admitting: Cardiology

## 2022-09-13 ENCOUNTER — Other Ambulatory Visit: Payer: Self-pay | Admitting: Cardiovascular Disease

## 2022-09-14 NOTE — Telephone Encounter (Signed)
Refill request

## 2022-09-15 ENCOUNTER — Encounter: Payer: Self-pay | Admitting: Cardiology

## 2022-09-18 NOTE — Telephone Encounter (Signed)
Pt reports her right ankle slightly swollen, more so than left. States occurring daily. Reports SBPs are avg 117-132, and thinks MD "wants her to be 125 or less".  But states she isn't sure.  Aware that I will forward this to her general cardiology team to address as they are more likely following BPs (and/or PCP). When pt seen last by [facility] in 04/2022 her BP was soft, Camnitz stopped combo and just had her take HCTZ. She thinks she needs to be back on her Valsartan. Forwarding to general cardiology team to address/follow up with pt. Pt aware it may be next week before she hears from them. Pt agreeable to plan.

## 2022-09-21 DIAGNOSIS — H3552 Pigmentary retinal dystrophy: Secondary | ICD-10-CM | POA: Diagnosis not present

## 2022-09-21 DIAGNOSIS — H35033 Hypertensive retinopathy, bilateral: Secondary | ICD-10-CM | POA: Diagnosis not present

## 2022-09-25 MED ORDER — VALSARTAN-HYDROCHLOROTHIAZIDE 80-12.5 MG PO TABS: 1.0000 | ORAL_TABLET | Freq: Every day | ORAL | 2 refills | Status: DC

## 2022-09-25 NOTE — Telephone Encounter (Signed)
Pt aware of MD recommendation. Advised to stop HCTZ Start Valsartan/HCTZ 80/12.5 mg daily Advised to call if SE occur after starting. Patient verbalized understanding and agreeable to plan.

## 2022-10-19 ENCOUNTER — Encounter: Payer: Self-pay | Admitting: Cardiology

## 2022-10-19 ENCOUNTER — Ambulatory Visit: Payer: Medicare Other | Attending: Cardiology | Admitting: Cardiology

## 2022-10-19 VITALS — BP 108/66 | HR 45 | Ht 67.0 in | Wt 173.0 lb

## 2022-10-19 DIAGNOSIS — I4719 Other supraventricular tachycardia: Secondary | ICD-10-CM | POA: Diagnosis not present

## 2022-10-19 DIAGNOSIS — I10 Essential (primary) hypertension: Secondary | ICD-10-CM | POA: Diagnosis not present

## 2022-10-19 NOTE — Progress Notes (Signed)
Electrophysiology Office Note   Date:  10/19/2022   ID:  Sheri Lopez, DOB [birth year], MRN [id number]  PCP:  Scarlett Presto Verda Cumins, FNP  Cardiologist:  Duke Salvia Primary Electrophysiologist:  Kyrah Schiro Jorja Loa, MD    No chief complaint on file.     History of Present Illness: Sheri Lopez is a 67 y.o. female who is being seen today for the evaluation of SVT at the request of Stamey, Verda Cumins, FNP. Presenting today for electrophysiology evaluation.   She has a history significant for hypertension, atrial tachycardia, legal blindness, breast cancer.  She had an echo that showed normal systolic function and a Myoview without ischemia.  Today, denies symptoms of palpitations, chest pain, shortness of breath, orthopnea, PND, lower extremity edema, claudication, dizziness, presyncope, syncope, bleeding, or neurologic sequela. The patient is tolerating medications without difficulties.  She is currently feeling well.  She has noted no palpitations.  She is somewhat concerned over her bradycardia.  She does not have weakness or fatigue on a daily basis.  She does state that when her blood pressure is low, she does get somewhat fatigued.  Past Medical History:  Diagnosis Date   Allergic rhinitis    Anxiety    Atrial tachycardia 01/27/2017   Atypical nevus 03/27/2016   Left Mid Back-Moderate to Severe(w/s widershave)   Atypical nevus 08/31/2017   Right Wrist-Atypical Proliferation (Excision)   Atypical nevus 04/12/2018   Right Upperarm-Atypical Proliferation (Excision)   Bradycardia    Cancer (HCC)    breast   Dizziness    Essential hypertension 12/08/2016   Fatigue 12/08/2016   Frozen shoulder    Headache    Heart murmur    Hyperlipidemia 12/08/2016   Hypertension    Lower extremity edema 12/08/2016   Palpitations 12/08/2016   Psoriasis    Retinitis pigmentosa    Shortness of breath 12/08/2016   Thyroid disease    Tinnitus    Venous insufficiency    Past Surgical  History:  Procedure Laterality Date   ABLATION ON ENDOMETRIOSIS     BREAST EXCISIONAL BIOPSY Right    fibrocystic cysts   BREAST EXCISIONAL BIOPSY Left 2013   cataracts     CESAREAN SECTION     x 1   eye surgeries     moles excised     x 2   NASAL SINUS SURGERY       Current Outpatient Medications  Medication Sig Dispense Refill   albuterol (PROVENTIL HFA;VENTOLIN HFA) 108 (90 Base) MCG/ACT inhaler Inhale 2 puffs into the lungs every 4 (four) hours as needed for shortness of breath. 1 Inhaler 2   aspirin EC 81 MG tablet Take 1 tablet (81 mg total) by mouth daily. Swallow whole. 90 tablet 3   CEQUA 0.09 % SOLN Apply 1 drop to eye 2 (two) times daily.     cholecalciferol (VITAMIN D3) 25 MCG (1000 UNIT) tablet Take 2,000 Units by mouth daily.     clobetasol cream (TEMOVATE) 0.05 % Apply 1 application topically as directed.     Cyanocobalamin (B-12 PO) Take 2 capsules by mouth daily.     famotidine (PEPCID) 20 MG tablet Take 20 mg by mouth 2 (two) times daily.     flecainide (TAMBOCOR) 50 MG tablet TAKE 1 TABLET(50 MG) BY MOUTH TWICE DAILY 180 tablet 3   levothyroxine (SYNTHROID, LEVOTHROID) 75 MCG tablet Take 75 mcg by mouth daily before breakfast.     rizatriptan (MAXALT-MLT) 10 MG disintegrating tablet Take 1 tablet (  10 mg total) by mouth as needed for migraine. May repeat in 2 hours if needed 9 tablet 11   rosuvastatin (CRESTOR) 10 MG tablet Take 1 tablet (10 mg total) by mouth daily. 90 tablet 3   sertraline (ZOLOFT) 50 MG tablet Take 75 mg by mouth daily. TOTAL 75 MG     topiramate (TOPAMAX) 50 MG tablet Take 1 tablet (50 mg total) by mouth daily. (Patient taking differently: Take 25 mg by mouth daily. 25 MG TOTAL 1/2 TABLET AT NIGHT) 90 tablet 3   valsartan-hydrochlorothiazide (DIOVAN HCT) 80-12.5 MG tablet Take 1 tablet by mouth daily. 90 tablet 2   No current facility-administered medications for this visit.    Allergies:   Beta adrenergic blockers, Topamax [topiramate], and  Keflex [cephalexin]   Social History:  The patient  reports that she has never smoked. She has never used smokeless tobacco. She reports that she does not currently use alcohol. She reports that she does not use drugs.   Family History:  [name] history includes Arrhythmia in her mother; Breast cancer in her maternal aunt; CAD in her father; Cancer in her father and mother; Heart attack in her maternal grandfather and paternal grandfather; Heart failure in her paternal grandmother; Hyperlipidemia in her mother; Hypertension in her brother, mother, and sister.   ROS:  Please see the history of present illness.   Otherwise, review of systems is positive for none.   All other systems are reviewed and negative.   PHYSICAL EXAM: VS:  BP 108/66   Pulse (!) 45   Ht 5\' 7"  (1.702 m)   Wt 173 lb (78.5 kg)   SpO2 98%   BMI 27.10 kg/m  , BMI Body mass index is 27.1 kg/m. GEN: Well nourished, well developed, in no acute distress  HEENT: normal  Neck: no JVD, carotid bruits, or masses Cardiac: RRR; no murmurs, rubs, or gallops,no edema  Respiratory:  clear to auscultation bilaterally, normal work of breathing GI: soft, nontender, nondistended, + BS MS: no deformity or atrophy  Skin: warm and dry Neuro:  Strength and sensation are intact Psych: euthymic mood, full affect  EKG:  EKG is ordered today. Personal review of the ekg ordered shows sinus bradycardia  Recent Labs: No results found for requested labs within last 365 days.    Lipid Panel     Component Value Date/Time   CHOL 153 03/19/2020 0827   TRIG 99 03/19/2020 0827   HDL 65 03/19/2020 0827   CHOLHDL 2.4 03/19/2020 0827   LDLCALC 70 03/19/2020 0827     Wt Readings from Last 3 Encounters:  10/19/22 173 lb (78.5 kg)  04/13/22 177 lb (80.3 kg)  02/10/22 180 lb (81.6 kg)      Other studies Reviewed: Additional studies/ records that were reviewed today include:    Myocardial perfusion Imaging 07/29/2018   Nuclear stress EF: 62%. The left ventricular ejection fraction is normal (55-65%). There was no ST segment deviation noted during stress. This is a low risk study. No evidence of ischemia or previous infarction The study is normal.  TTE 12/21/16 - Left ventricle: The cavity size was normal. Systolic function was   normal. The estimated ejection fraction was in the range of 55%   to 60%. Wall motion was normal; there were no regional wall   motion abnormalities. Left ventricular diastolic function   parameters were normal.  Cardiac monitor 01/26/17 - personally reviewed Predominant rhythm: sinus rhythm, sinus bradycardia Average heart rate: 62 bpm  Sinus rhythm and sinus bradycardia   PACs, PVCs Atrial tachycardia  Cardiac monitor 02/23/2022 Predominant rhythm was sinus rhythm Less than 1% supraventricular ectopy 1.3% ventricular ectopy Triggered episodes associated with sinus rhythm and sinus tachycardia  ETT 02/17/17 Blood pressure demonstrated a normal response to exercise. Nonspecific ST changes noted during exercise. Immediate recovery there was 1mm of horizontal ST segment depression in the inferolateral leads Abnormal exercise stress test in recovery. The patient had no symptoms during the stress test. Submaximal ETT with patient achieving only 74% maximum predicted heart rate. The patient achieved 9.1 mets. Recommed pharmacolgic myocardial perfusion scan.  ASSESSMENT AND PLAN:  1.  Atrial tachycardia: Currently on flecainide.  No further episodes of atrial tachycardia.  Well-controlled on flecainide.  No changes.  2.  Hyperlipidemia: Continue Crestor per primary care  3.  Hypertension: Currently on Diovan/HCTZ.  Currently well-controlled.  I have told her that on days that her blood pressure is running low she can hold her medications.  Current medicines are reviewed at length with the patient today.   The patient has concerns regarding her medicines.  The following  changes were made today: none   Labs/ tests ordered today include:  Orders Placed This Encounter  Procedures   EKG 12-Lead      Disposition:   FU 6 months  Signed, Makaylynn Bonillas Jorja Loa, MD  10/19/2022 12:42 PM     [facility] [address] [place] [address] [phone] (office) [phone] (fax)

## 2022-10-19 NOTE — Patient Instructions (Signed)
Medication Instructions:  °Your physician recommends that you continue on your current medications as directed. Please refer to the Current Medication list given to you today. ° °*If you need a refill on your cardiac medications before your next appointment, please call your pharmacy* ° ° °Lab Work: °None ordered ° ° °Testing/Procedures: °None ordered ° ° °Follow-Up: °At [facility], you and your health needs are our priority.  As part of our continuing mission to provide you with exceptional heart care, we have created designated Provider Care Teams.  These Care Teams include your primary Cardiologist (physician) and Advanced Practice Providers (APPs -  Physician Assistants and Nurse Practitioners) who all work together to provide you with the care you need, when you need it. ° °Your next appointment:   °6 month(s) ° °The format for your next appointment:   °In Person ° °Provider:   °Will Camnitz, MD ° ° ° °Thank you for choosing [facility]!! ° ° °Camiyah Friberg, RN °[phone] °  °

## 2022-10-26 ENCOUNTER — Encounter: Payer: Self-pay | Admitting: Cardiovascular Disease

## 2022-11-01 ENCOUNTER — Encounter: Payer: Self-pay | Admitting: Cardiovascular Disease

## 2022-11-03 ENCOUNTER — Other Ambulatory Visit: Payer: Self-pay | Admitting: *Deleted

## 2022-11-03 DIAGNOSIS — I739 Peripheral vascular disease, unspecified: Secondary | ICD-10-CM

## 2022-11-03 DIAGNOSIS — M79606 Pain in leg, unspecified: Secondary | ICD-10-CM

## 2022-12-04 ENCOUNTER — Other Ambulatory Visit: Payer: Self-pay | Admitting: Cardiovascular Disease

## 2022-12-04 DIAGNOSIS — M79606 Pain in leg, unspecified: Secondary | ICD-10-CM

## 2022-12-04 DIAGNOSIS — I739 Peripheral vascular disease, unspecified: Secondary | ICD-10-CM

## 2022-12-10 ENCOUNTER — Encounter (HOSPITAL_COMMUNITY): Payer: Medicare Other

## 2022-12-11 ENCOUNTER — Ambulatory Visit (HOSPITAL_COMMUNITY)
Admission: RE | Admit: 2022-12-11 | Discharge: 2022-12-11 | Disposition: A | Discharge status: Home / Self Care | Payer: Medicare Other | Source: Ambulatory Visit | Attending: Cardiovascular Disease | Admitting: Cardiovascular Disease

## 2022-12-11 DIAGNOSIS — M79606 Pain in leg, unspecified: Secondary | ICD-10-CM | POA: Insufficient documentation

## 2022-12-11 DIAGNOSIS — M79662 Pain in left lower leg: Secondary | ICD-10-CM | POA: Diagnosis not present

## 2022-12-11 DIAGNOSIS — I739 Peripheral vascular disease, unspecified: Secondary | ICD-10-CM

## 2022-12-11 LAB — VAS US ABI WITH/WO TBI
Left ABI: 1.14
Right ABI: 1.13

## 2022-12-16 DIAGNOSIS — H7201 Central perforation of tympanic membrane, right ear: Secondary | ICD-10-CM | POA: Diagnosis not present

## 2022-12-16 DIAGNOSIS — R42 Dizziness and giddiness: Secondary | ICD-10-CM | POA: Diagnosis not present

## 2022-12-16 DIAGNOSIS — H9041 Sensorineural hearing loss, unilateral, right ear, with unrestricted hearing on the contralateral side: Secondary | ICD-10-CM | POA: Diagnosis not present

## 2023-01-08 ENCOUNTER — Other Ambulatory Visit (HOSPITAL_BASED_OUTPATIENT_CLINIC_OR_DEPARTMENT_OTHER): Payer: Self-pay | Admitting: Family Medicine

## 2023-01-08 ENCOUNTER — Ambulatory Visit (HOSPITAL_BASED_OUTPATIENT_CLINIC_OR_DEPARTMENT_OTHER): Admission: RE | Admit: 2023-01-08 | Payer: Medicare Other | Source: Ambulatory Visit | Admitting: Radiology

## 2023-01-08 DIAGNOSIS — M542 Cervicalgia: Secondary | ICD-10-CM | POA: Diagnosis not present

## 2023-01-08 DIAGNOSIS — Z043 Encounter for examination and observation following other accident: Secondary | ICD-10-CM | POA: Diagnosis not present

## 2023-01-08 DIAGNOSIS — E559 Vitamin D deficiency, unspecified: Secondary | ICD-10-CM | POA: Diagnosis not present

## 2023-01-08 DIAGNOSIS — M50221 Other cervical disc displacement at C4-C5 level: Secondary | ICD-10-CM | POA: Diagnosis not present

## 2023-01-08 DIAGNOSIS — E785 Hyperlipidemia, unspecified: Secondary | ICD-10-CM | POA: Diagnosis not present

## 2023-01-08 DIAGNOSIS — M19042 Primary osteoarthritis, left hand: Secondary | ICD-10-CM | POA: Insufficient documentation

## 2023-01-08 DIAGNOSIS — M25551 Pain in right hip: Secondary | ICD-10-CM | POA: Diagnosis not present

## 2023-01-08 DIAGNOSIS — W19XXXA Unspecified fall, initial encounter: Secondary | ICD-10-CM | POA: Insufficient documentation

## 2023-01-08 DIAGNOSIS — Z0000 Encounter for general adult medical examination without abnormal findings: Secondary | ICD-10-CM | POA: Diagnosis not present

## 2023-01-08 DIAGNOSIS — M160 Bilateral primary osteoarthritis of hip: Secondary | ICD-10-CM | POA: Diagnosis not present

## 2023-01-08 DIAGNOSIS — E039 Hypothyroidism, unspecified: Secondary | ICD-10-CM | POA: Diagnosis not present

## 2023-01-08 DIAGNOSIS — N1831 Chronic kidney disease, stage 3a: Secondary | ICD-10-CM | POA: Diagnosis not present

## 2023-01-08 DIAGNOSIS — I878 Other specified disorders of veins: Secondary | ICD-10-CM | POA: Diagnosis not present

## 2023-01-08 DIAGNOSIS — M25552 Pain in left hip: Secondary | ICD-10-CM | POA: Diagnosis not present

## 2023-01-08 DIAGNOSIS — M79642 Pain in left hand: Secondary | ICD-10-CM | POA: Diagnosis not present

## 2023-01-08 DIAGNOSIS — M50321 Other cervical disc degeneration at C4-C5 level: Secondary | ICD-10-CM | POA: Insufficient documentation

## 2023-01-08 DIAGNOSIS — I10 Essential (primary) hypertension: Secondary | ICD-10-CM | POA: Diagnosis not present

## 2023-01-12 ENCOUNTER — Other Ambulatory Visit: Payer: Self-pay | Admitting: Family Medicine

## 2023-01-12 DIAGNOSIS — Z1231 Encounter for screening mammogram for malignant neoplasm of breast: Secondary | ICD-10-CM

## 2023-01-12 DIAGNOSIS — E2839 Other primary ovarian failure: Secondary | ICD-10-CM

## 2023-01-19 ENCOUNTER — Ambulatory Visit: Payer: Self-pay | Admitting: Cardiovascular Disease

## 2023-01-19 ENCOUNTER — Ambulatory Visit: Payer: Medicare Other | Admitting: Family Medicine

## 2023-01-20 ENCOUNTER — Encounter: Payer: Self-pay | Admitting: Family Medicine

## 2023-02-02 ENCOUNTER — Ambulatory Visit: Payer: Medicare Other | Attending: Cardiovascular Disease | Admitting: Cardiovascular Disease

## 2023-02-02 ENCOUNTER — Encounter: Payer: Self-pay | Admitting: Cardiovascular Disease

## 2023-02-02 VITALS — BP 118/76 | HR 53 | Ht 67.0 in | Wt 180.2 lb

## 2023-02-02 DIAGNOSIS — E785 Hyperlipidemia, unspecified: Secondary | ICD-10-CM | POA: Diagnosis not present

## 2023-02-02 DIAGNOSIS — I4719 Other supraventricular tachycardia: Secondary | ICD-10-CM

## 2023-02-02 DIAGNOSIS — I739 Peripheral vascular disease, unspecified: Secondary | ICD-10-CM | POA: Diagnosis not present

## 2023-02-02 DIAGNOSIS — I10 Essential (primary) hypertension: Secondary | ICD-10-CM | POA: Diagnosis not present

## 2023-02-02 NOTE — Progress Notes (Signed)
Cardiology Office Note   Date:  02/02/2023   ID:  Sheri Lopez, DOB 1955/09/23, MRN [id number]  PCP:  Genia Hotter, FNP  Cardiologist: Dr. Aniket Paye/Dr. Elberta Fortis  No chief complaint on file.      History of Present Illness: Sheri Lopez is a 67 y.o. female who is here today for follow-up visit regarding peripheral arterial disease.  She has known history of essential hypertension, PVCs, migraine, anxiety, atrial tachycardia, legal blindness and breast cancer.  Previous cardiac work-up revealed normal LV systolic function.  She is followed for peripheral arterial disease with abdominal bruits.  Noninvasive vascular studies in 2021 with revealed normal ABI bilaterally.  Duplex showed no evidence of abdominal aortic aneurysm.  There was moderate bilateral external iliac artery disease with peak velocity of 271 on the left and 263 on the right. She is being treated medically given lack of symptoms related to peripheral arterial disease.   She sprained her right ankle last year and developed increased swelling on the right side.  In addition, she started having some right leg discomfort with prominent varicose veins.  She was concerned about progression of peripheral arterial disease.  We repeated her arterial Doppler which showed normal ABI bilaterally.   Past Medical History:  Diagnosis Date   Allergic rhinitis    Anxiety    Atrial tachycardia 01/27/2017   Atypical nevus 03/27/2016   Left Mid Back-Moderate to Severe(w/s widershave)   Atypical nevus 08/31/2017   Right Wrist-Atypical Proliferation (Excision)   Atypical nevus 04/12/2018   Right Upperarm-Atypical Proliferation (Excision)   Bradycardia    Cancer (HCC)    breast   Dizziness    Essential hypertension 12/08/2016   Fatigue 12/08/2016   Frozen shoulder    Headache    Heart murmur    Hyperlipidemia 12/08/2016   Hypertension    Lower extremity edema 12/08/2016   Palpitations 12/08/2016   Psoriasis    Retinitis  pigmentosa    Shortness of breath 12/08/2016   Thyroid disease    Tinnitus    Venous insufficiency     Past Surgical History:  Procedure Laterality Date   ABLATION ON ENDOMETRIOSIS     BREAST EXCISIONAL BIOPSY Right    fibrocystic cysts   BREAST EXCISIONAL BIOPSY Left 2013   cataracts     CESAREAN SECTION     x 1   eye surgeries     moles excised     x 2   NASAL SINUS SURGERY       Current Outpatient Medications  Medication Sig Dispense Refill   albuterol (PROVENTIL HFA;VENTOLIN HFA) 108 (90 Base) MCG/ACT inhaler Inhale 2 puffs into the lungs every 4 (four) hours as needed for shortness of breath. 1 Inhaler 2   aspirin EC 81 MG tablet Take 1 tablet (81 mg total) by mouth daily. Swallow whole. 90 tablet 3   CEQUA 0.09 % SOLN Apply 1 drop to eye 2 (two) times daily.     cholecalciferol (VITAMIN D3) 25 MCG (1000 UNIT) tablet Take 2,000 Units by mouth daily.     clobetasol cream (TEMOVATE) 0.05 % Apply 1 application  topically as directed. As needed     Cyanocobalamin (B-12 PO) Take 2 capsules by mouth daily.     famotidine (PEPCID) 20 MG tablet Take 20 mg by mouth as needed.     flecainide (TAMBOCOR) 50 MG tablet TAKE 1 TABLET(50 MG) BY MOUTH TWICE DAILY 180 tablet 3   levothyroxine (SYNTHROID, LEVOTHROID) 75 MCG  tablet Take 75 mcg by mouth daily before breakfast.     rizatriptan (MAXALT-MLT) 10 MG disintegrating tablet Take 1 tablet (10 mg total) by mouth as needed for migraine. May repeat in 2 hours if needed 9 tablet 11   rosuvastatin (CRESTOR) 10 MG tablet Take 1 tablet (10 mg total) by mouth daily. 90 tablet 3   sertraline (ZOLOFT) 50 MG tablet Take 75 mg by mouth daily. TOTAL 75 MG     valsartan-hydrochlorothiazide (DIOVAN HCT) 80-12.5 MG tablet Take 1 tablet by mouth daily. 90 tablet 2   No current facility-administered medications for this visit.    Allergies:   Beta adrenergic blockers, Topamax [topiramate], and Keflex [cephalexin]    Social History:  The patient   reports that she has never smoked. She has never used smokeless tobacco. She reports that she does not currently use alcohol. She reports that she does not use drugs.   Family History:  [name] history includes Arrhythmia in her mother; Breast cancer in her maternal aunt; CAD in her father; Cancer in her father and mother; Heart attack in her maternal grandfather and paternal grandfather; Heart failure in her paternal grandmother; Hyperlipidemia in her mother; Hypertension in her brother, mother, and sister.    ROS:  Please see the history of present illness.   Otherwise, review of systems are positive for none.   All other systems are reviewed and negative.    PHYSICAL EXAM: VS:  BP 118/76   Pulse (!) 53   Ht 5\' 7"  (1.702 m)   Wt 180 lb 3.2 oz (81.7 kg)   SpO2 98%   BMI 28.22 kg/m  , BMI Body mass index is 28.22 kg/m. GEN: Well nourished, well developed, in no acute distress  HEENT: normal  Neck: no JVD, carotid bruits, or masses Cardiac: RRR; no murmurs, rubs, or gallops, mild bilateral leg edema  Respiratory:  clear to auscultation bilaterally, normal work of breathing GI: soft, nontender, nondistended, + BS MS: no deformity or atrophy  Skin: warm and dry, no rash Neuro:  Strength and sensation are intact Psych: euthymic mood, full affect Vascular: Distal pedal pulses are palpable.  Femoral pulses are normal bilaterally.   EKG:  EKG is not ordered today.     Recent Labs: No results found for requested labs within last 365 days.    Lipid Panel    Component Value Date/Time   CHOL 153 03/19/2020 0827   TRIG 99 03/19/2020 0827   HDL 65 03/19/2020 0827   CHOLHDL 2.4 03/19/2020 0827   LDLCALC 70 03/19/2020 0827      Wt Readings from Last 3 Encounters:  02/02/23 180 lb 3.2 oz (81.7 kg)  10/19/22 173 lb (78.5 kg)  04/13/22 177 lb (80.3 kg)           No data to display            ASSESSMENT AND PLAN:  1. Peripheral arterial disease: The  patient has evidence of moderate bilateral external iliac artery disease and she does have bilateral bruits.  However, she still does not show evidence of obstructive disease.  Her pulses are normal throughout and recent ABI was normal.  Recent right leg symptoms are not related to peripheral arterial disease and more likely to be due to arthritis and possible neuropathic component.  Continue medical therapy.  2. Hyperlipidemia: Continue treatment with rosuvastatin.  Most recent lipid profile showed an LDL of 70.  3. Essential hypertension: Blood pressures well controlled on  current medications.  4.  Atrial tachycardia: Stable on flecainide.  She is followed by Dr. Elberta Fortis.    Disposition:   FU with me in 12 months  Signed,  Lorine Bears, MD  02/02/2023 10:30 AM    [facility]

## 2023-02-02 NOTE — Patient Instructions (Signed)

## 2023-02-09 ENCOUNTER — Encounter: Payer: Self-pay | Admitting: Family Medicine

## 2023-02-11 ENCOUNTER — Other Ambulatory Visit: Payer: Self-pay | Admitting: Cardiovascular Disease

## 2023-02-11 NOTE — Telephone Encounter (Signed)
Refill request

## 2023-02-16 ENCOUNTER — Ambulatory Visit
Admission: RE | Admit: 2023-02-16 | Discharge: 2023-02-16 | Disposition: A | Discharge status: Home / Self Care | Payer: Medicare Other | Source: Ambulatory Visit | Attending: Family Medicine | Admitting: Family Medicine

## 2023-02-16 DIAGNOSIS — Z1231 Encounter for screening mammogram for malignant neoplasm of breast: Secondary | ICD-10-CM | POA: Diagnosis not present

## 2023-02-23 ENCOUNTER — Ambulatory Visit: Payer: Medicare Other | Admitting: Cardiovascular Disease

## 2023-03-02 ENCOUNTER — Ambulatory Visit: Payer: Self-pay | Admitting: Cardiovascular Disease

## 2023-03-22 ENCOUNTER — Telehealth: Payer: Self-pay | Admitting: *Deleted

## 2023-03-22 NOTE — Telephone Encounter (Signed)
   Pre-operative Risk Assessment    Patient Name: Sheri Lopez  DOB: [birth year] MRN: [id number]  DATE OF LAST VISIT: 02/02/23 DR. ARIDA DATE OF NEXT VISIT: 04/20/23 DR. CAMNITZ    Request for Surgical Clearance    Procedure:   COLONOSCOPY  Date of Surgery:  Clearance 03/26/23   STAT PER FORM                              Surgeon:  DR. [facility] Surgeon's Group or Practice Name:  [facility] Phone number:  [phone] Fax number:  [phone]   Type of Clearance Requested:   - Medical ; NONE INDICATED NEEDING TO BE HELD   Type of Anesthesia:   PROPOFOL   Additional requests/questions:    Elpidio Anis   03/22/2023, 8:24 AM

## 2023-03-22 NOTE — Telephone Encounter (Signed)
     Primary Cardiologist: Dr. Kirke Corin  Chart reviewed as part of pre-operative protocol coverage. Given past medical history and time since last visit, based on ACC/AHA guidelines, Sheri Lopez would be at acceptable risk for the planned procedure without further cardiovascular testing.    I will route this recommendation to the requesting party via Epic fax function and remove from pre-op pool.  Please call with questions.  Thomasene Ripple. Kendahl Bumgardner NP-C     03/22/2023, 8:51 AM [facility] [address] Office [phone] Fax [phone]

## 2023-03-26 DIAGNOSIS — Z860101 Personal history of adenomatous and serrated colon polyps: Secondary | ICD-10-CM | POA: Diagnosis not present

## 2023-03-26 DIAGNOSIS — K635 Polyp of colon: Secondary | ICD-10-CM | POA: Diagnosis not present

## 2023-03-26 DIAGNOSIS — K5730 Diverticulosis of large intestine without perforation or abscess without bleeding: Secondary | ICD-10-CM | POA: Diagnosis not present

## 2023-03-26 DIAGNOSIS — Z09 Encounter for follow-up examination after completed treatment for conditions other than malignant neoplasm: Secondary | ICD-10-CM | POA: Diagnosis not present

## 2023-03-26 DIAGNOSIS — D123 Benign neoplasm of transverse colon: Secondary | ICD-10-CM | POA: Diagnosis not present

## 2023-03-26 DIAGNOSIS — K648 Other hemorrhoids: Secondary | ICD-10-CM | POA: Diagnosis not present

## 2023-03-30 DIAGNOSIS — K635 Polyp of colon: Secondary | ICD-10-CM | POA: Diagnosis not present

## 2023-03-30 DIAGNOSIS — D123 Benign neoplasm of transverse colon: Secondary | ICD-10-CM | POA: Diagnosis not present

## 2023-04-20 ENCOUNTER — Encounter: Payer: Self-pay | Admitting: Cardiology

## 2023-04-20 ENCOUNTER — Ambulatory Visit: Payer: Medicare Other | Attending: Cardiology | Admitting: Cardiology

## 2023-04-20 VITALS — BP 130/76 | HR 51 | Ht 67.0 in | Wt 183.4 lb

## 2023-04-20 DIAGNOSIS — I10 Essential (primary) hypertension: Secondary | ICD-10-CM

## 2023-04-20 DIAGNOSIS — I4719 Other supraventricular tachycardia: Secondary | ICD-10-CM | POA: Diagnosis not present

## 2023-04-20 DIAGNOSIS — I471 Supraventricular tachycardia, unspecified: Secondary | ICD-10-CM | POA: Diagnosis not present

## 2023-04-20 NOTE — Progress Notes (Signed)
  Electrophysiology Office Note:   Date:  04/20/2023  ID:  Sheri Lopez, DOB 1955/09/07, MRN [id number]  Primary Cardiologist: None Electrophysiologist: Sheri Pfahler Jorja Loa, MD      History of Present Illness:   Sheri Lopez is a 67 y.o. female with h/o SVT, hypertension, atrial tachycardia, legal blindness, breast cancer seen today for routine electrophysiology followup.   Since last being seen in our clinic the patient reports doing well.  She has no chest pain or shortness of breath.  She is unaware of palpitations.  She is able to do all of her daily activities.  She feels quite well and is without complaint.  she denies chest pain, palpitations, dyspnea, PND, orthopnea, nausea, vomiting, dizziness, syncope, edema, weight gain, or early satiety.   Review of systems complete and found to be negative unless listed in HPI.   EP Information / Studies Reviewed:    EKG is ordered today. Personal review as below.  EKG Interpretation Date/Time:  Tuesday April 20 2023 09:38:47 EST Ventricular Rate:  51 PR Interval:  192 QRS Duration:  76 QT Interval:  472 QTC Calculation: 435 R Axis:   67  Text Interpretation: Sinus bradycardia Low voltage QRS Septal infarct , age undetermined No previous ECGs available Confirmed by Sheri Lopez ([id number]) on 04/20/2023 9:47:54 AM     Risk Assessment/Calculations:              Physical Exam:   VS:  BP 130/76 (BP Location: Left Arm, Patient Position: Sitting, Cuff Size: Normal)   Pulse (!) 51   Ht 5\' 7"  (1.702 m)   Wt 183 lb 6.4 oz (83.2 kg)   SpO2 98%   BMI 28.72 kg/m    Wt Readings from Last 3 Encounters:  04/20/23 183 lb 6.4 oz (83.2 kg)  02/02/23 180 lb 3.2 oz (81.7 kg)  10/19/22 173 lb (78.5 kg)     GEN: Well nourished, well developed in no acute distress NECK: No JVD; No carotid bruits CARDIAC: Regular rate and rhythm, no murmurs, rubs, gallops RESPIRATORY:  Clear to auscultation without rales, wheezing or rhonchi   ABDOMEN: Soft, non-tender, non-distended EXTREMITIES:  No edema; No deformity   ASSESSMENT AND PLAN:    1.  Atrial tachycardia: Currently on flecainide.  Well without further arrhythmia.  Sheri Lopez continue with current management.  2.  Hyperlipidemia: Continue Crestor per primary care  3.  Hypertension: Currently well-controlled  4.  Peripheral arterial disease: Plan per cardiology  Follow up with EP APP in 6 months  Signed, Sheri Schnorr Jorja Loa, MD

## 2023-04-28 DIAGNOSIS — L821 Other seborrheic keratosis: Secondary | ICD-10-CM | POA: Diagnosis not present

## 2023-04-28 DIAGNOSIS — L853 Xerosis cutis: Secondary | ICD-10-CM | POA: Diagnosis not present

## 2023-04-28 DIAGNOSIS — L218 Other seborrheic dermatitis: Secondary | ICD-10-CM | POA: Diagnosis not present

## 2023-05-05 ENCOUNTER — Ambulatory Visit: Payer: Medicare Other | Admitting: Family Medicine

## 2023-05-20 DIAGNOSIS — M25561 Pain in right knee: Secondary | ICD-10-CM | POA: Diagnosis not present

## 2023-05-20 DIAGNOSIS — M25551 Pain in right hip: Secondary | ICD-10-CM | POA: Diagnosis not present

## 2023-05-20 DIAGNOSIS — M545 Low back pain, unspecified: Secondary | ICD-10-CM | POA: Diagnosis not present

## 2023-05-20 DIAGNOSIS — M2550 Pain in unspecified joint: Secondary | ICD-10-CM | POA: Diagnosis not present

## 2023-06-08 DIAGNOSIS — M5416 Radiculopathy, lumbar region: Secondary | ICD-10-CM | POA: Diagnosis not present

## 2023-06-08 DIAGNOSIS — M1611 Unilateral primary osteoarthritis, right hip: Secondary | ICD-10-CM | POA: Diagnosis not present

## 2023-06-10 DIAGNOSIS — M25551 Pain in right hip: Secondary | ICD-10-CM | POA: Diagnosis not present

## 2023-06-15 ENCOUNTER — Other Ambulatory Visit: Payer: Self-pay | Admitting: Cardiology

## 2023-06-17 DIAGNOSIS — M5416 Radiculopathy, lumbar region: Secondary | ICD-10-CM | POA: Diagnosis not present

## 2023-06-17 DIAGNOSIS — M1611 Unilateral primary osteoarthritis, right hip: Secondary | ICD-10-CM | POA: Diagnosis not present

## 2023-06-18 ENCOUNTER — Ambulatory Visit (INDEPENDENT_AMBULATORY_CARE_PROVIDER_SITE_OTHER): Payer: Medicare Other

## 2023-06-18 DIAGNOSIS — M545 Low back pain, unspecified: Secondary | ICD-10-CM | POA: Diagnosis not present

## 2023-06-28 DIAGNOSIS — M1991 Primary osteoarthritis, unspecified site: Secondary | ICD-10-CM | POA: Diagnosis not present

## 2023-06-28 DIAGNOSIS — M25571 Pain in right ankle and joints of right foot: Secondary | ICD-10-CM | POA: Diagnosis not present

## 2023-06-28 DIAGNOSIS — M1611 Unilateral primary osteoarthritis, right hip: Secondary | ICD-10-CM | POA: Diagnosis not present

## 2023-06-28 DIAGNOSIS — M5459 Other low back pain: Secondary | ICD-10-CM | POA: Diagnosis not present

## 2023-06-28 DIAGNOSIS — M5416 Radiculopathy, lumbar region: Secondary | ICD-10-CM | POA: Diagnosis not present

## 2023-06-28 DIAGNOSIS — R768 Other specified abnormal immunological findings in serum: Secondary | ICD-10-CM | POA: Diagnosis not present

## 2023-07-01 DIAGNOSIS — M1611 Unilateral primary osteoarthritis, right hip: Secondary | ICD-10-CM | POA: Diagnosis not present

## 2023-07-05 DIAGNOSIS — M1611 Unilateral primary osteoarthritis, right hip: Secondary | ICD-10-CM | POA: Diagnosis not present

## 2023-07-05 DIAGNOSIS — M5416 Radiculopathy, lumbar region: Secondary | ICD-10-CM | POA: Diagnosis not present

## 2023-07-08 ENCOUNTER — Telehealth (INDEPENDENT_AMBULATORY_CARE_PROVIDER_SITE_OTHER): Payer: Self-pay | Admitting: Otolaryngology

## 2023-07-08 NOTE — Telephone Encounter (Signed)
 Spoke with patient and confirmed address and appt for 07/09/2023.

## 2023-07-09 ENCOUNTER — Ambulatory Visit (INDEPENDENT_AMBULATORY_CARE_PROVIDER_SITE_OTHER): Payer: Medicare Other | Admitting: Audiology

## 2023-07-09 ENCOUNTER — Ambulatory Visit (INDEPENDENT_AMBULATORY_CARE_PROVIDER_SITE_OTHER): Payer: Self-pay | Admitting: Audiology

## 2023-07-09 ENCOUNTER — Encounter (INDEPENDENT_AMBULATORY_CARE_PROVIDER_SITE_OTHER): Payer: Self-pay

## 2023-07-09 ENCOUNTER — Ambulatory Visit (INDEPENDENT_AMBULATORY_CARE_PROVIDER_SITE_OTHER): Payer: Medicare Other | Admitting: Otolaryngology

## 2023-07-09 VITALS — BP 161/79 | HR 60 | Ht 67.5 in | Wt 180.0 lb

## 2023-07-09 DIAGNOSIS — H9041 Sensorineural hearing loss, unilateral, right ear, with unrestricted hearing on the contralateral side: Secondary | ICD-10-CM

## 2023-07-09 DIAGNOSIS — H903 Sensorineural hearing loss, bilateral: Secondary | ICD-10-CM | POA: Diagnosis not present

## 2023-07-09 DIAGNOSIS — H6121 Impacted cerumen, right ear: Secondary | ICD-10-CM

## 2023-07-09 DIAGNOSIS — H90A21 Sensorineural hearing loss, unilateral, right ear, with restricted hearing on the contralateral side: Secondary | ICD-10-CM

## 2023-07-09 DIAGNOSIS — R42 Dizziness and giddiness: Secondary | ICD-10-CM

## 2023-07-09 NOTE — Progress Notes (Signed)
 Patient ID: Sheri Lopez, female   DOB: [birth year], 68 y.o.   MRN: [id number]  Follow-up: Right ear Mnire's disease, right ear hearing loss, tinnitus  HPI: The patient is a 68 year old female who returns today for her follow-up evaluation. The patient has a history of right ear Mnire's disease, right ear hearing loss, and tinnitus. Her previous MRI scan was negative for intracranial lesion.  She was treated with dexamethasone  infusion to treat her asymmetric right ear hearing loss.  The patient returns today reporting fluctuating right ear hearing loss.  She has noted occasional mild dizziness.  She is currently on a low-salt diet.  She denies any otalgia or otorrhea.  She complains that her right ear hearing has worsened.  Exam: General: Communicates without difficulty, well nourished, no acute distress. Head: Normocephalic, no evidence injury, no tenderness, facial buttresses intact without stepoff. Eyes: PERRL, EOMI. No scleral icterus, conjunctivae clear. Neuro: CN II exam reveals vision grossly decreased. No nystagmus at any point of gaze. Ears: Auricles well formed without lesions.  The right ear tube has extruded into the ear canal, and is encased within cerumen.  Under the operating microscope, the right ear cerumen and tube are removed using a cerumen curette.  The right tympanic membrane is intact and mobile.  The left tympanic membrane and middle ear space are also normal.  Nose: External evaluation reveals normal support and skin without lesions. Dorsum is intact. Anterior rhinoscopy reveals congested mucosa over anterior aspect of inferior turbinates and intact septum. No purulence noted. Oral:  Oral cavity and oropharynx are intact, symmetric, without erythema or edema. Mucosa is moist without lesions. Neck: Full range of motion without pain. There is no significant lymphadenopathy. No masses palpable. Thyroid  bed within normal limits to palpation. Parotid glands and submandibular glands  equal bilaterally without mass. Trachea is midline. Neuro:  CN 2-12 grossly intact. Gait wide-based. Vestibular: No nystagmus at any point of gaze. The cerebellar examination is unremarkable.   Procedure: Right ear cerumen disimpaction and tube removal Anesthesia: None Description: Under the operating microscope, the cerumen and extruded tube are carefully removed with a combination of cerumen currettes.  After the cerumen is removed, the TMs are noted to be normal.  No mass, erythema, or lesions. The patient tolerated the procedure well.    Slight progression of her asymmetric right ear sensorineural hearing loss.  Assessment 1.  Progressive worsening of her asymmetric right ear sensorineural hearing loss. 2.  Stable right ear Mnire's disease.  Previous brain MRI scan was negative. 3.  Her right ventilating tube has extruded and is encased within impacted cerumen.  The tubes and cerumen are removed without difficulty. 4.  The patient's ear canals, tympanic membranes, and middle ear spaces are all normal.  Plan:  1.  The physical exam findings are reviewed with the patient. 2.  Otomicroscopy with right ear cerumen and tube removal. 3.  Continue with 1500 mg low salt diet and HCTZ. 4.  The patient is released from her dry ear precautions. 5.  Continue the use of her hearing aids. 6.  The patient will return for reevaluation in 6 months.

## 2023-07-09 NOTE — Progress Notes (Signed)
  [address] [place], [place] [address] [phone]  Audiological Evaluation    Name: Sheri Lopez     DOB:   07-07-1955      MRN:   [id number]                                                                                     Service Date: 07/09/2023     Accompanied by: unaccompanied    Patient comes today after Dr. Karis, ENT sent a referral for a hearing evaluation due to concerns with possible right sided  hearing loss decline.   Symptoms Yes Details  Hearing loss  [x]  Previous audiogram was complete at Dr. Rojean clinic and showed a right hearing asymmetry, (worse). Patient thinks her hearing may have declined.  Tinnitus  [x]  Reports a loud right noise that sounds like a vacuum cleaner.   Ear pain/ Ear infections  []    Balance problems  [x]  Reports spinning when laying down/sitting up when she is doing exercises. She is diagnosed with Meniere's by Dr. Karis.  Noise exposure  []    Previous ear surgeries  []    Family history  []    Amplification  [x]  Has a right ReSound hearing aid  Other  [x]  Reports migraines. Also reports that some loud noises seem like they vibrate    Otoscopy: Right ear: Clear external ear canals and notable landmarks visualized on the tympanic membrane. Left ear:  Clear external ear canals and notable landmarks visualized on the tympanic membrane.  Tympanometry: Right ear: Type A- Normal external ear canal volume with normal middle ear pressure and tympanic membrane compliance Left ear: Type A- Normal external ear canal volume with normal middle ear pressure and tympanic membrane compliance  Pure tone Audiometry: Right ear- Moderate to severe sensorineural hearing loss from 125 Hz - 8000 Hz. Left ear-  Normal hearing from [phone] Hz, then mild to moderate sensorineural hearing loss from 3000 Hz - 8000 Hz.  Speech Audiometry: Right ear- Speech Reception Threshold (SRT) was obtained at 70 dBHL, with contralateral masking Left  ear-Speech Reception Threshold (SRT) was obtained at 20 dBHL   Word Recognition Score Tested using NU-6 (MLV) Right ear: 60% was obtained at a presentation level of 90 dBHL with contralateral masking which is deemed as  poor Left ear: 96% was obtained at a presentation level of 55 dBHL with contralateral masking which is deemed as  excellent  The hearing test results were completed under headphones and re-checked with inserts and results are deemed to be of good reliability. Test technique:  conventional    Impression: There was a significant decline in pure-tone thresholds and word recognition score in the right ear when compared to the previous audiogram on file.   Recommendations: Follow up with ENT as scheduled for today. Return for a hearing evaluation if concerns with hearing changes arise or per MD recommendation. Reprogram hearing aids.    Denaya Horn MARIE LEROUX-MARTINEZ, AUD

## 2023-07-09 NOTE — Progress Notes (Signed)
  [address] [place], [place] [address] [phone]  Hearing Aid Check     Sheri Lopez was worked-in for a hearing aid check.      Right  Hearing aid manufacturer Clancy Griffith 5 [id number]  Hearing aid style Receiver in the canal  Hearing aid battery rechargeable  Receiver  Dome/ custom earpiece Small or medium power dome  Retention wire   Warranty expiration date 01-26-2025  Loss and Damage 01-26-2025  Initial fitting date 01-02-2022  Device was fit at: Dr. [facility]je[facility]    Chief complaint: Patient reports that the hearing aid sounds too high-pitched.  Actions taken: Inspection of the device and listening check showed that it had lost the wax filter. A new filter was placed in the receiver and cleaned the microphones. Also helped the patient with the ReSound App.  Services fee: $0 was paid at checkout.  Patient was oriented about returning to the clinic at some point when we have the ReSound software working again in order to reprogram her hearing aids to her current hearing status.  Recommend: Return for a hearing aid check , to be scheduled in the near future. Return for a hearing evaluation and to see an ENT, if concerns with hearing changes arise.    Sheri Lopez, AUD

## 2023-07-11 DIAGNOSIS — R42 Dizziness and giddiness: Secondary | ICD-10-CM | POA: Insufficient documentation

## 2023-07-11 DIAGNOSIS — H6121 Impacted cerumen, right ear: Secondary | ICD-10-CM | POA: Insufficient documentation

## 2023-07-11 DIAGNOSIS — H9041 Sensorineural hearing loss, unilateral, right ear, with unrestricted hearing on the contralateral side: Secondary | ICD-10-CM | POA: Insufficient documentation

## 2023-07-12 DIAGNOSIS — M1611 Unilateral primary osteoarthritis, right hip: Secondary | ICD-10-CM | POA: Diagnosis not present

## 2023-07-12 DIAGNOSIS — H5203 Hypermetropia, bilateral: Secondary | ICD-10-CM | POA: Diagnosis not present

## 2023-07-12 DIAGNOSIS — M5416 Radiculopathy, lumbar region: Secondary | ICD-10-CM | POA: Diagnosis not present

## 2023-07-21 DIAGNOSIS — M4727 Other spondylosis with radiculopathy, lumbosacral region: Secondary | ICD-10-CM | POA: Diagnosis not present

## 2023-07-21 DIAGNOSIS — R768 Other specified abnormal immunological findings in serum: Secondary | ICD-10-CM | POA: Diagnosis not present

## 2023-07-21 DIAGNOSIS — L409 Psoriasis, unspecified: Secondary | ICD-10-CM | POA: Diagnosis not present

## 2023-07-21 DIAGNOSIS — M1991 Primary osteoarthritis, unspecified site: Secondary | ICD-10-CM | POA: Diagnosis not present

## 2023-07-22 ENCOUNTER — Other Ambulatory Visit: Payer: Self-pay | Admitting: Family Medicine

## 2023-07-22 ENCOUNTER — Ambulatory Visit (INDEPENDENT_AMBULATORY_CARE_PROVIDER_SITE_OTHER): Payer: Self-pay | Admitting: Audiology

## 2023-07-22 ENCOUNTER — Other Ambulatory Visit: Payer: Medicare Other

## 2023-07-22 DIAGNOSIS — L538 Other specified erythematous conditions: Secondary | ICD-10-CM | POA: Diagnosis not present

## 2023-07-22 DIAGNOSIS — Z789 Other specified health status: Secondary | ICD-10-CM | POA: Diagnosis not present

## 2023-07-22 DIAGNOSIS — L814 Other melanin hyperpigmentation: Secondary | ICD-10-CM | POA: Diagnosis not present

## 2023-07-22 DIAGNOSIS — E2839 Other primary ovarian failure: Secondary | ICD-10-CM

## 2023-07-22 DIAGNOSIS — D1801 Hemangioma of skin and subcutaneous tissue: Secondary | ICD-10-CM | POA: Diagnosis not present

## 2023-07-22 DIAGNOSIS — R208 Other disturbances of skin sensation: Secondary | ICD-10-CM | POA: Diagnosis not present

## 2023-07-22 DIAGNOSIS — L820 Inflamed seborrheic keratosis: Secondary | ICD-10-CM | POA: Diagnosis not present

## 2023-07-22 DIAGNOSIS — L821 Other seborrheic keratosis: Secondary | ICD-10-CM | POA: Diagnosis not present

## 2023-07-22 DIAGNOSIS — L309 Dermatitis, unspecified: Secondary | ICD-10-CM | POA: Diagnosis not present

## 2023-07-22 DIAGNOSIS — H90A21 Sensorineural hearing loss, unilateral, right ear, with restricted hearing on the contralateral side: Secondary | ICD-10-CM

## 2023-07-22 NOTE — Progress Notes (Signed)
  [address] [place], [place] [address] [phone]  Hearing Aid Check     Sheri Lopez comes for a scheduled appointment for a hearing aid check.         Right  Hearing aid manufacturer [name] 5 [id number]  Hearing aid style Receiver in the canal  Hearing aid battery rechargeable  Receiver  Dome/ custom earpiece Small or medium power dome  Retention wire    Warranty expiration date 01-26-2025  Loss and Damage 01-26-2025  Initial fitting date 01-02-2022  Device was fit at: Dr[facility] clinic        Patient returns after the [facility] software was checked by IT.  Actions taken: Reprogrammed the hearing aids given her hearing changes noted during her last hearing test. Changes were made up until the patient reported to like the sound quality/loudness.   Services fee: $0 was paid at checkout.  Patient was oriented about ideally having the hearing aid tested with real ear measurements , but given that I do not have the equipment in the clinic I am unable to provide the service.   Recommend: Return for a hearing aid check , as needed. Return for a hearing evaluation and to see an ENT, if concerns with hearing changes arise.    Sheri Lopez, AUD

## 2023-07-23 ENCOUNTER — Other Ambulatory Visit: Payer: Medicare Other

## 2023-07-26 DIAGNOSIS — M5416 Radiculopathy, lumbar region: Secondary | ICD-10-CM | POA: Diagnosis not present

## 2023-07-26 DIAGNOSIS — M1611 Unilateral primary osteoarthritis, right hip: Secondary | ICD-10-CM | POA: Diagnosis not present

## 2023-08-17 ENCOUNTER — Other Ambulatory Visit: Payer: Medicare Other

## 2023-08-31 ENCOUNTER — Other Ambulatory Visit: Payer: Self-pay | Admitting: Cardiology

## 2023-09-02 ENCOUNTER — Telehealth: Payer: Self-pay | Admitting: Family Medicine

## 2023-09-02 NOTE — Telephone Encounter (Signed)
 Pt LVM at 1:46 pm went off of Topiramate last summer and I think I'm having migraines again and would like to talk to someone about it.

## 2023-09-02 NOTE — Telephone Encounter (Signed)
 Spoke to patient Made office visit appointment 09/06/2023 with Amy,NP pt was appreciative and thanked me for calling

## 2023-09-06 ENCOUNTER — Encounter: Payer: Self-pay | Admitting: Family Medicine

## 2023-09-06 ENCOUNTER — Ambulatory Visit: Admitting: Family Medicine

## 2023-09-06 VITALS — BP 168/90 | HR 49 | Ht 67.0 in | Wt 186.0 lb

## 2023-09-06 DIAGNOSIS — R42 Dizziness and giddiness: Secondary | ICD-10-CM | POA: Diagnosis not present

## 2023-09-06 DIAGNOSIS — R030 Elevated blood-pressure reading, without diagnosis of hypertension: Secondary | ICD-10-CM | POA: Diagnosis not present

## 2023-09-06 DIAGNOSIS — G43109 Migraine with aura, not intractable, without status migrainosus: Secondary | ICD-10-CM

## 2023-09-06 NOTE — Patient Instructions (Signed)
 Below is our plan:  We will continue to monitor. Try taking Mucinex (plain) ER 1200mg  twice daily. Take Allegra 1 tablet daily. Drink at least 3 bottles of water daily. Keep a close eye on your blood pressure. Let me know how you are doing in 1-2 weeks.   Please make sure you are staying well hydrated. I recommend 50-60 ounces daily. Well balanced diet and regular exercise encouraged. Consistent sleep schedule with 6-8 hours recommended.   Please continue follow up with care team as directed.   Follow up with me in 6 months   You may receive a survey regarding today's visit. I encourage you to leave honest feed back as I do use this information to improve patient care. Thank you for seeing me today!   GENERAL HEADACHE INFORMATION:   Natural supplements: Magnesium Oxide or Magnesium Glycinate 500 mg at bed (up to 800 mg daily) Coenzyme Q10 300 mg in AM Vitamin B2- 200 mg twice a day   Add 1 supplement at a time since even natural supplements can have undesirable side effects. You can sometimes buy supplements cheaper (especially Coenzyme Q10) at www.WebmailGuide.co.za or at [facility].  Migraine with aura: There is increased risk for stroke in women with migraine with aura and a contraindication for the combined contraceptive pill for use by women who have migraine with aura. The risk for women with migraine without aura is lower. However other risk factors like smoking are far more likely to increase stroke risk than migraine. There is a recommendation for no smoking and for the use of OCPs without estrogen such as progestogen only pills particularly for women with migraine with aura.Marland Kitchen People who have migraine headaches with auras may be 3 times more likely to have a stroke caused by a blood clot, compared to migraine patients who don't see auras. Women who take hormone-replacement therapy may be 30 percent more likely to suffer a clot-based stroke than women not taking medication containing estrogen.  Other risk factors like smoking and high blood pressure may be  much more important.    Vitamins and herbs that show potential:   Magnesium: Magnesium (250 mg twice a day or 500 mg at bed) has a relaxant effect on smooth muscles such as blood vessels. Individuals suffering from frequent or daily headache usually have low magnesium levels which can be increase with daily supplementation of 400-750 mg. Three trials found 40-90% average headache reduction  when used as a preventative. Magnesium may help with headaches are aura, the best evidence for magnesium is for migraine with aura is its thought to stop the cortical spreading depression we believe is the pathophysiology of migraine aura.Magnesium also demonstrated the benefit in menstrually related migraine.  Magnesium is part of the messenger system in the serotonin cascade and it is a good muscle relaxant.  It is also useful for constipation which can be a side effect of other medications used to treat migraine. Good sources include nuts, whole grains, and tomatoes. Side Effects: loose stool/diarrhea  Riboflavin (vitamin B 2) 200 mg twice a day. This vitamin assists nerve cells in the production of ATP a principal energy storing molecule.  It is necessary for many chemical reactions in the body.  There have been at least 3 clinical trials of riboflavin using 400 mg per day all of which suggested that migraine frequency can be decreased.  All 3 trials showed significant improvement in over half of migraine sufferers.  The supplement is found in bread, cereal, milk,  meat, and poultry.  Most Americans get more riboflavin than the recommended daily allowance, however riboflavin deficiency is not necessary for the supplements to help prevent headache. Side effects: energizing, green urine   Coenzyme Q10: This is present in almost all cells in the body and is critical component for the conversion of energy.  Recent studies have shown that a nutritional  supplement of CoQ10 can reduce the frequency of migraine attacks by improving the energy production of cells as with riboflavin.  Doses of 150 mg twice a day have been shown to be effective.   Melatonin: Increasing evidence shows correlation between melatonin secretion and headache conditions.  Melatonin supplementation has decreased headache intensity and duration.  It is widely used as a sleep aid.  Sleep is natures way of dealing with migraine.  A dose of 3 mg is recommended to start for headaches including cluster headache. Higher doses up to 15 mg has been reviewed for use in Cluster headache and have been used. The rationale behind using melatonin for cluster is that many theories regarding the cause of Cluster headache center around the disruption of the normal circadian rhythm in the brain.  This helps restore the normal circadian rhythm.   HEADACHE DIET: Foods and beverages which may trigger migraine Note that only 20% of headache patients are food sensitive. You will know if you are food sensitive if you get a headache consistently 20 minutes to 2 hours after eating a certain food. Only cut out a food if it causes headaches, otherwise you might remove foods you enjoy! What matters most for diet is to eat a well balanced healthy diet full of vegetables and low fat protein, and to not miss meals.   Chocolate, other sweets ALL cheeses except cottage and cream cheese Dairy products, yogurt, sour cream, ice cream Liver Meat extracts (Bovril, Marmite, meat tenderizers) Meats or fish which have undergone aging, fermenting, pickling or smoking. These include: Hotdogs,salami,Lox,sausage, mortadellas,smoked salmon, pepperoni, Pickled herring Pods of broad bean (English beans, Chinese pea pods, [place] (fava) beans, lima and navy beans Ripe avocado, ripe banana Yeast extracts or active yeast preparations such as Brewer's or Fleishman's (commercial bakes goods are permitted) Tomato based foods, pizza  (lasagna, etc.)   MSG (monosodium glutamate) is disguised as many things; look for these common aliases: Monopotassium glutamate Autolysed yeast Hydrolysed protein Sodium caseinate "flavorings" "all natural preservatives" Nutrasweet   Avoid all other foods that convincingly provoke headaches.   Resources: The Dizzy [place] Laundry Your Headache Diet, migrainestrong.com  https://zamora-andrews.com/   Caffeine and Migraine For patients that have migraine, caffeine intake more than 3 days per week can lead to dependency and increased migraine frequency. I would recommend cutting back on your caffeine intake as best you can. The recommended amount of caffeine is 200-300 mg daily, although migraine patients may experience dependency at even lower doses. While you may notice an increase in headache temporarily, cutting back will be helpful for headaches in the long run. For more information on caffeine and migraine, visit: https://americanmigrainefoundation.org/resource-library/caffeine-and-migraine/   Headache Prevention Strategies:   1. Maintain a headache diary; learn to identify and avoid triggers.  - This can be a simple note where you log when you had a headache, associated symptoms, and medications used - There are several smartphone apps developed to help track migraines: Migraine Buddy, Migraine Monitor, Curelator N1-Headache App   Common triggers include: Emotional triggers: Emotional/Upset family or friends Emotional/Upset occupation Business reversal/success Anticipation anxiety Crisis-serious Post-crisis periodNew job/position  Physical triggers: Vacation Day Weekend Strenuous Exercise High Altitude Location New Move Menstrual Day Physical Illness Oversleep/Not enough sleep Weather changes Light: Photophobia or light sesnitivity treatment involves a balance between desensitization and reduction in overly strong input. Use  dark polarized glasses outside, but not inside. Avoid bright or fluorescent light, but do not dim environment to the point that going into a normally lit room hurts. Consider FL-41 tint lenses, which reduce the most irritating wavelengths without blocking too much light.  These can be obtained at axonoptics.com or theraspecs.com Foods: see list above.   2. Limit use of acute treatments (over-the-counter medications, triptans, etc.) to no more than 2 days per week or 10 days per month to prevent medication overuse headache (rebound headache).     3. Follow a regular schedule (including weekends and holidays): Don't skip meals. Eat a balanced diet. 8 hours of sleep nightly. Minimize stress. Exercise 30 minutes per day. Being overweight is associated with a 5 times increased risk of chronic migraine. Keep well hydrated and drink 6-8 glasses of water per day.   4. Initiate non-pharmacologic measures at the earliest onset of your headache. Rest and quiet environment. Relax and reduce stress. Breathe2Relax is a free app that can instruct you on    some simple relaxtion and breathing techniques. Http://Dawnbuse.com is a    free website that provides teaching videos on relaxation.  Also, there are  many apps that   can be downloaded for "mindful" relaxation.  An app called YOGA NIDRA will help walk you through mindfulness. Another app called Calm can be downloaded to give you a structured mindfulness guide with daily reminders and skill development. Headspace for guided meditation Mindfulness Based Stress Reduction Online Course: www.palousemindfulness.com Cold compresses.   5. Don't wait!! Take the maximum allowable dosage of prescribed medication at the first sign of migraine.   6. Compliance:  Take prescribed medication regularly as directed and at the first sign of a migraine.   7. Communicate:  Call your physician when problems arise, especially if your headaches change, increase in  frequency/severity, or become associated with neurological symptoms (weakness, numbness, slurred speech, etc.). Proceed to emergency room if you experience new or worsening symptoms or symptoms do not resolve, if you have new neurologic symptoms or if headache is severe, or for any concerning symptom.   8. Headache/pain management therapies: Consider various complementary methods, including medication, behavioral therapy, psychological counselling, biofeedback, massage therapy, acupuncture, dry needling, and other modalities.  Such measures may reduce the need for medications. Counseling for pain management, where patients learn to function and ignore/minimize their pain, seems to work very well.   9. Recommend changing family's attention and focus away from patient's headaches. Instead, emphasize daily activities. If first question of day is 'How are your headaches/Do you have a headache today?', then patient will constantly think about headaches, thus making them worse. Goal is to re-direct attention away from headaches, toward daily activities and other distractions.   10. Helpful Websites: www.AmericanHeadacheSociety.org PatentHood.ch www.headaches.org TightMarket.nl www.achenet.org

## 2023-09-06 NOTE — Progress Notes (Signed)
 Chief Complaint  Patient presents with   Follow-up    Pt in 1 with husband Pt states increasing migraines pt states 6 migraines in last month Pt states stopped taking topiramate in August 2024    HISTORY OF PRESENT ILLNESS:  09/06/23 ALL:  Sheri Lopez returns for follow up for migraines. She was last seen 12/2021 and doing well. We attempted to wean topiramate but resumed 04/2022 due to worsening headaches. Since, she reports weaning topiramate again over the summer of 2024 and stopped 12/2022. She had experienced worsening dizziness but symptoms symptoms seemed to settle down. Over the past 2 months, she has had more trouble with dizziness and headaches. She describes a tension sensation from temple to temple that runs through maxillary sinuses. She switched Zytec to Allegra and has take two tablets daily for the past week. She reports BP has been up and down. She drinks about 1-2 bottles of water daily.   01/20/2022 ALL:  Sheri Lopez returns for follow up for headaches. She continues topiramate 50mg  QHS. She reports headaches are very well managed. She was diagnosed with Meniere's last fall. She has reduced salt intake and reports symptoms are significant improved. PT was very helpful. BP meds were advised and she has hearing aids. She is feeling well and without complaints.   01/20/2021 ALL: Sheri Lopez returns for follow up. She has continued topiramate 50mg  daily. She could not tolerate propranolol (reports low BP). She feels headaches are well managed. Cardiology changed BP meds. She is participating in PT that helps. She feels seasonal allergies contribute. She has not used rizatriptan. It does help when she needs it.   09/02/2020 ALL: Sheri Lopez is a 68 y.o. female here today for follow up for migraines. She was started on topiramate at consult with Dr Marjory Lies in 12/2019. She called about a month later with concerns of sores in her mouth and throat and topiramate discontinued. Amovig started and  worked well but she could afford to continue. She is also concerned that it caused leg pains, however, has vascular disease that could be contributed. She restarted topiramate 06/2020. Rizatriptan for abortive therapy. She feels that headaches are better. She may have 7-10 headaches a month. She has used rizatriptan twice in 6 months. It did work well for abortive therapy. Anxiety is a major trigger for her. She takes propranolol 10mg  as needed for palpitations managed with cardiology. She tolerates it well but could not tolerate metoprolol daily.    HISTORY (copied from Dr Richrd Humbles previous note)  68 year old female here for evaluation of headaches.  History of retinitis pigmentosa, hypertension, hyperlipidemia, thyroid disease, psoriasis, breast cancer.   Patient had headaches with migraine features in the 1990s.  These went away spontaneously.  Since 2020 has recurrence of similar but worsening headaches recently with right eye, right temporal, right posterior auricular pain, throbbing sensation, nausea, sensitivity to light and sound, dizziness.  She feels her heartbeat and throbbing sensation.  Triggers include allergies and fluorescent lights.  Headaches can last up to 3 days at a time.  Patient averaging up to 15 days of headache per month.  Sometimes she sees white spots and sparkles. Had MRI brain which was unremarkable in July 2020 for these symptoms.    Patient does have retinitis pigmentosa and has had multiple eye procedures and surgeries.   Patient has tried Excedrin Migraine with mild relief.  Family history of migraine in her daughter.  REVIEW OF SYSTEMS: Out of a complete 14 system review of  symptoms, the patient complains only of the following symptoms, headaches, leg pain, seasonal allergies and all other reviewed systems are negative.  ALLERGIES: Allergies  Allergen Reactions   Beta Adrenergic Blockers     Lethargy, severe headaches   Topamax [Topiramate] Other (See  Comments)    Severe heartburn, reflux   Keflex [Cephalexin] Hives     HOME MEDICATIONS: Outpatient Medications Prior to Visit  Medication Sig Dispense Refill   albuterol (PROVENTIL HFA;VENTOLIN HFA) 108 (90 Base) MCG/ACT inhaler Inhale 2 puffs into the lungs every 4 (four) hours as needed for shortness of breath. 1 Inhaler 2   aspirin EC 81 MG tablet Take 1 tablet (81 mg total) by mouth daily. Swallow whole. 90 tablet 3   CEQUA 0.09 % SOLN Apply 1 drop to eye 2 (two) times daily.     cholecalciferol (VITAMIN D3) 25 MCG (1000 UNIT) tablet Take 2,000 Units by mouth daily.     clobetasol cream (TEMOVATE) 0.05 % Apply 1 application  topically as directed. As needed     Cyanocobalamin (B-12 PO) Take 2 capsules by mouth daily.     famotidine (PEPCID) 20 MG tablet Take 20 mg by mouth as needed.     fexofenadine (ALLEGRA ALLERGY) 180 MG tablet 1 tablet Swallow whole with water; do not take with fruit juices. Orally Once a day     flecainide (TAMBOCOR) 50 MG tablet TAKE 1 TABLET(50 MG) BY MOUTH TWICE DAILY 180 tablet 3   levothyroxine (SYNTHROID, LEVOTHROID) 75 MCG tablet Take 75 mcg by mouth daily before breakfast.     rosuvastatin (CRESTOR) 10 MG tablet TAKE 1 TABLET(10 MG) BY MOUTH DAILY 90 tablet 3   sertraline (ZOLOFT) 50 MG tablet Take 75 mg by mouth daily. TOTAL 75 MG     valsartan-hydrochlorothiazide (DIOVAN-HCT) 80-12.5 MG tablet TAKE 1 TABLET BY MOUTH DAILY 90 tablet 2   CELEBREX 100 MG capsule Take 100 mg by mouth 2 (two) times daily.     rizatriptan (MAXALT-MLT) 10 MG disintegrating tablet Take 1 tablet (10 mg total) by mouth as needed for migraine. May repeat in 2 hours if needed (Patient not taking: Reported on 07/09/2023) 9 tablet 11   No facility-administered medications prior to visit.     PAST MEDICAL HISTORY: Past Medical History:  Diagnosis Date   Allergic rhinitis    Anxiety    Atrial tachycardia (HCC) 01/27/2017   Atypical nevus 03/27/2016   Left Mid Back-Moderate to  Severe(w/s widershave)   Atypical nevus 08/31/2017   Right Wrist-Atypical Proliferation (Excision)   Atypical nevus 04/12/2018   Right Upperarm-Atypical Proliferation (Excision)   Bradycardia    Cancer (HCC)    breast   Dizziness    Essential hypertension 12/08/2016   Fatigue 12/08/2016   Frozen shoulder    Headache    Heart murmur    Hyperlipidemia 12/08/2016   Hypertension    Lower extremity edema 12/08/2016   Palpitations 12/08/2016   Psoriasis    Retinitis pigmentosa    Shortness of breath 12/08/2016   Thyroid disease    Tinnitus    Venous insufficiency      PAST SURGICAL HISTORY: Past Surgical History:  Procedure Laterality Date   ABLATION ON ENDOMETRIOSIS     BREAST EXCISIONAL BIOPSY Right    fibrocystic cysts   BREAST EXCISIONAL BIOPSY Left 2013   cataracts     CESAREAN SECTION     x 1   eye surgeries     moles excised     x  2   NASAL SINUS SURGERY       FAMILY HISTORY: Family History  Problem Relation Age of Onset   Cancer Mother    Hyperlipidemia Mother    Hypertension Mother    Arrhythmia Mother    Cancer Father    CAD Father    Hypertension Sister    Hypertension Brother    Breast cancer Maternal Aunt    Heart attack Maternal Grandfather    Heart failure Paternal Grandmother    Heart attack Paternal Grandfather    Migraines Daughter    Migraines Daughter      SOCIAL HISTORY: Social History   Socioeconomic History   Marital status: Married    Spouse name: Doug   Number of children: 3   Years of education: Not on file   Highest education level: Bachelor's degree (e.g., BA, AB, BS)  Occupational History   Not on file  Tobacco Use   Smoking status: Never   Smokeless tobacco: Never  Substance and Sexual Activity   Alcohol use: Yes    Comment: occ   Drug use: Never   Sexual activity: Not on file  Other Topics Concern   Not on file  Social History Narrative   Lives with husband   Pt blind    Social Drivers of Health   Financial  Resource Strain: Not on file  Food Insecurity: Not on file  Transportation Needs: Not on file  Physical Activity: Not on file  Stress: Not on file  Social Connections: Not on file  Intimate Partner Violence: Not on file      PHYSICAL EXAM  Vitals:   09/06/23 1429 09/06/23 1500 09/06/23 1503  BP: (!) 160/74 (!) 170/90 (!) 168/90  Pulse: (!) 49    Weight: 186 lb (84.4 kg)    Height: 5\' 7"  (1.702 m)       Body mass index is 29.13 kg/m.   Generalized: Well developed, in no acute distress  Cardiology: normal rate and rhythm, no murmur auscultated  Respiratory: clear to auscultation bilaterally    Neurological examination  Mentation: Alert oriented to time, place, history taking. Follows all commands speech and language fluent Cranial nerve II-XII: Pupils not reactive, left irregular post surgery. Extraocular movements were full. Decreased visual acuity centrally. Facial sensation and strength were normal. Head turning and shoulder shrug  were normal and symmetric. Motor: The motor testing reveals 5 over 5 strength of all 4 extremities. Good symmetric motor tone is noted throughout.  Gait and station: Gait is normal. Uses white cane.     DIAGNOSTIC DATA (LABS, IMAGING, TESTING) - I reviewed patient records, labs, notes, testing and imaging myself where available.  No results found for: "WBC", "HGB", "HCT", "MCV", "PLT"    Component Value Date/Time   NA 140 09/15/2019 1027   K 4.6 09/15/2019 1027   CL 99 09/15/2019 1027   CO2 25 09/15/2019 1027   GLUCOSE 87 09/15/2019 1027   BUN 19 09/15/2019 1027   CREATININE 0.91 09/15/2019 1027   CALCIUM 9.5 09/15/2019 1027   PROT 6.7 03/19/2020 0827   ALBUMIN 4.4 03/19/2020 0827   AST 19 03/19/2020 0827   ALT 14 03/19/2020 0827   ALKPHOS 55 03/19/2020 0827   BILITOT 0.4 03/19/2020 0827   GFRNONAA 67 09/15/2019 1027   GFRAA 77 09/15/2019 1027   Lab Results  Component Value Date   CHOL 153 03/19/2020   HDL 65 03/19/2020    LDLCALC 70 03/19/2020   TRIG 99 03/19/2020  CHOLHDL 2.4 03/19/2020   No results found for: "HGBA1C" No results found for: "VITAMINB12" No results found for: "TSH"      No data to display               No data to display           ASSESSMENT AND PLAN  68 y.o. year old female  has a past medical history of Allergic rhinitis, Anxiety, Atrial tachycardia (HCC) (01/27/2017), Atypical nevus (03/27/2016), Atypical nevus (08/31/2017), Atypical nevus (04/12/2018), Bradycardia, Cancer (HCC), Dizziness, Essential hypertension (12/08/2016), Fatigue (12/08/2016), Frozen shoulder, Headache, Heart murmur, Hyperlipidemia (12/08/2016), Hypertension, Lower extremity edema (12/08/2016), Palpitations (12/08/2016), Psoriasis, Retinitis pigmentosa, Shortness of breath (12/08/2016), Thyroid disease, Tinnitus, and Venous insufficiency. here with   Migraine with aura and without status migrainosus, not intractable  Elevated blood pressure reading  Dizziness  Sheri Lopez reports headaches seemed well managed off topiramate until recently. Similar increase in symptoms last fall. I will have her try Mucinex ER 1200mg  BID for 2 weeks. I have encouraged her to resume 1 tablet of Allegra daily and monitor BP closely at home. I have encouraged her to increase water intake and try to limit sodium. We will consider resuming topiramate if this plan is not helpful. Healthy lifestyle habits encouraged. She will follow up in 1 year. She and her husband verbalize understanding and agreement with this plan.    No orders of the defined types were placed in this encounter.     No orders of the defined types were placed in this encounter.   I spent 30 minutes of face-to-face and non-face-to-face time with patient.  This included previsit chart review, lab review, study review, order entry, electronic health record documentation, patient education.    Shawnie Dapper, MSN, FNP-C 09/06/2023, 3:31 PM  [facility] [address], [address] [place], [place] [address] [phone]

## 2023-09-07 DIAGNOSIS — M5416 Radiculopathy, lumbar region: Secondary | ICD-10-CM | POA: Diagnosis not present

## 2023-09-22 NOTE — Telephone Encounter (Signed)
 Error

## 2023-09-24 DIAGNOSIS — M5416 Radiculopathy, lumbar region: Secondary | ICD-10-CM | POA: Diagnosis not present

## 2023-10-02 ENCOUNTER — Encounter: Payer: Self-pay | Admitting: Family Medicine

## 2023-10-19 DIAGNOSIS — M7061 Trochanteric bursitis, right hip: Secondary | ICD-10-CM | POA: Diagnosis not present

## 2023-10-19 DIAGNOSIS — M5416 Radiculopathy, lumbar region: Secondary | ICD-10-CM | POA: Diagnosis not present

## 2023-10-26 ENCOUNTER — Encounter: Payer: Self-pay | Admitting: Family Medicine

## 2023-10-30 ENCOUNTER — Emergency Department (HOSPITAL_BASED_OUTPATIENT_CLINIC_OR_DEPARTMENT_OTHER)

## 2023-10-30 ENCOUNTER — Other Ambulatory Visit: Payer: Self-pay

## 2023-10-30 ENCOUNTER — Other Ambulatory Visit (HOSPITAL_BASED_OUTPATIENT_CLINIC_OR_DEPARTMENT_OTHER): Payer: Self-pay

## 2023-10-30 ENCOUNTER — Emergency Department (HOSPITAL_BASED_OUTPATIENT_CLINIC_OR_DEPARTMENT_OTHER): Admission: EM | Admit: 2023-10-30 | Discharge: 2023-10-30 | Disposition: A | Discharge status: Home / Self Care

## 2023-10-30 ENCOUNTER — Encounter (HOSPITAL_BASED_OUTPATIENT_CLINIC_OR_DEPARTMENT_OTHER): Payer: Self-pay | Admitting: Emergency Medicine

## 2023-10-30 DIAGNOSIS — R519 Headache, unspecified: Secondary | ICD-10-CM | POA: Diagnosis not present

## 2023-10-30 DIAGNOSIS — Z7982 Long term (current) use of aspirin: Secondary | ICD-10-CM | POA: Insufficient documentation

## 2023-10-30 DIAGNOSIS — G9389 Other specified disorders of brain: Secondary | ICD-10-CM | POA: Diagnosis not present

## 2023-10-30 DIAGNOSIS — R42 Dizziness and giddiness: Secondary | ICD-10-CM | POA: Insufficient documentation

## 2023-10-30 DIAGNOSIS — R001 Bradycardia, unspecified: Secondary | ICD-10-CM | POA: Diagnosis not present

## 2023-10-30 LAB — URINALYSIS, ROUTINE W REFLEX MICROSCOPIC
Bacteria, UA: NONE SEEN
Bilirubin Urine: NEGATIVE
Glucose, UA: NEGATIVE mg/dL
Leukocytes,Ua: NEGATIVE
Nitrite: NEGATIVE
Protein, ur: NEGATIVE mg/dL
Specific Gravity, Urine: 1.007 (ref 1.005–1.030)
pH: 6.5 (ref 5.0–8.0)

## 2023-10-30 LAB — COMPREHENSIVE METABOLIC PANEL WITH GFR
ALT: 14 U/L (ref 0–44)
AST: 22 U/L (ref 15–41)
Albumin: 4.5 g/dL (ref 3.5–5.0)
Alkaline Phosphatase: 48 U/L (ref 38–126)
Anion gap: 15 (ref 5–15)
BUN: 17 mg/dL (ref 8–23)
CO2: 25 mmol/L (ref 22–32)
Calcium: 10.1 mg/dL (ref 8.9–10.3)
Chloride: 98 mmol/L (ref 98–111)
Creatinine, Ser: 1.01 mg/dL — ABNORMAL HIGH (ref 0.44–1.00)
GFR, Estimated: >60 mL/min (ref >60)
Glucose, Bld: 103 mg/dL — ABNORMAL HIGH (ref 70–99)
Potassium: 3.2 mmol/L — ABNORMAL LOW (ref 3.5–5.1)
Sodium: 137 mmol/L (ref 135–145)
Total Bilirubin: 0.5 mg/dL (ref 0.0–1.2)
Total Protein: 7.3 g/dL (ref 6.5–8.1)

## 2023-10-30 LAB — CBC WITH DIFFERENTIAL/PLATELET
Abs Immature Granulocytes: 0.03 10*3/uL (ref 0.00–0.07)
Basophils Absolute: 0.1 10*3/uL (ref 0.0–0.1)
Basophils Relative: 1 %
Eosinophils Absolute: 0 10*3/uL (ref 0.0–0.5)
Eosinophils Relative: 0 %
HCT: 38.9 % (ref 36.0–46.0)
Hemoglobin: 12.9 g/dL (ref 12.0–15.0)
Immature Granulocytes: 0 %
Lymphocytes Relative: 15 %
Lymphs Abs: 1.4 10*3/uL (ref 0.7–4.0)
MCH: 28.9 pg (ref 26.0–34.0)
MCHC: 33.2 g/dL (ref 30.0–36.0)
MCV: 87.2 fL (ref 80.0–100.0)
Monocytes Absolute: 0.8 10*3/uL (ref 0.1–1.0)
Monocytes Relative: 9 %
Neutro Abs: 7.1 10*3/uL (ref 1.7–7.7)
Neutrophils Relative %: 75 %
Platelets: 270 10*3/uL (ref 150–400)
RBC: 4.46 MIL/uL (ref 3.87–5.11)
RDW: 13.7 % (ref 11.5–15.5)
WBC: 9.4 10*3/uL (ref 4.0–10.5)
nRBC: 0 % (ref 0.0–0.2)

## 2023-10-30 MED ORDER — ONDANSETRON HCL 4 MG/2ML IJ SOLN
4.0000 mg | Freq: Once | INTRAMUSCULAR | Status: AC
  Administered 2023-10-30: 4 mg via INTRAVENOUS
  Filled 2023-10-30: qty 2

## 2023-10-30 MED ORDER — ONDANSETRON 4 MG PO TBDP
4.0000 mg | ORAL_TABLET | Freq: Three times a day (TID) | ORAL | 0 refills | Status: AC | PRN
  Filled 2023-10-30: qty 20, 7d supply, fill #0

## 2023-10-30 MED ORDER — SODIUM CHLORIDE 0.9 % IV BOLUS
1000.0000 mL | Freq: Once | INTRAVENOUS | Status: AC
  Administered 2023-10-30: 1000 mL via INTRAVENOUS

## 2023-10-30 NOTE — ED Notes (Signed)
 Pt given fluids for a PO challenge... Provider ordered... Pt had no N/V during or after fluids being given.Sheri AasAaron Lopez

## 2023-10-30 NOTE — ED Provider Notes (Signed)
 [place] EMERGENCY DEPARTMENT AT [facility] Provider Note   CSN: [id number] Arrival date & time: 10/30/23  1012     History {Add pertinent medical, surgical, social history, OB history to HPI:1} No chief complaint on file.   Sheri Lopez is a 68 y.o. female.  Patient to ED complains of multiple episodes of headache, usually right sided, and dizziness, identical to previous recurrent bouts of vertigo. She took Meclizine yesterday with resolution of symptoms. Today she vomited after taking Meclizine today, and symptoms have all but resolved despite. She reports the episode this morning lasted about 2 hours. No fever, chest pain, cough, congestion, SOB, urinary symptoms. She reports a mild residual headache over the left eye. No visual changes, however, has poor vision at baseline.   The history is provided by the patient. No language interpreter was used.       Home Medications Prior to Admission medications   Medication Sig Start Date End Date Taking? Authorizing Provider  ondansetron (ZOFRAN-ODT) 4 MG disintegrating tablet Take 1 tablet (4 mg total) by mouth every 8 (eight) hours as needed for nausea or vomiting. 10/30/23  Yes Obediah Welles, Clovis Dar, PA-C  albuterol  (PROVENTIL  HFA;VENTOLIN  HFA) 108 (90 Base) MCG/ACT inhaler Inhale 2 puffs into the lungs every 4 (four) hours as needed for shortness of breath. 02/09/17   Maudine Sos, MD  aspirin  EC 81 MG tablet Take 1 tablet (81 mg total) by mouth daily. Swallow whole. 01/16/20   Wenona Hamilton, MD  CEQUA 0.09 % SOLN Apply 1 drop to eye 2 (two) times daily. 07/07/22   [provider]  cholecalciferol (VITAMIN D3) 25 MCG (1000 UNIT) tablet Take 2,000 Units by mouth daily.    [provider]  clobetasol cream (TEMOVATE) 0.05 % Apply 1 application  topically as directed. As needed    [provider]  Cyanocobalamin (B-12 PO) Take 2 capsules by mouth daily.    [provider]  famotidine  (PEPCID) 20 MG tablet Take 20 mg by mouth as needed.    [provider]  fexofenadine (ALLEGRA ALLERGY) 180 MG tablet 1 tablet Swallow whole with water; do not take with fruit juices. Orally Once a day    [provider]  flecainide  (TAMBOCOR ) 50 MG tablet TAKE 1 TABLET(50 MG) BY MOUTH TWICE DAILY 09/01/23   Camnitz, Babetta Lesch, MD  levothyroxine (SYNTHROID, LEVOTHROID) 75 MCG tablet Take 75 mcg by mouth daily before breakfast.    [provider]  rosuvastatin  (CRESTOR ) 10 MG tablet TAKE 1 TABLET(10 MG) BY MOUTH DAILY 02/12/23   Wenona Hamilton, MD  sertraline (ZOLOFT) 50 MG tablet Take 75 mg by mouth daily. TOTAL 75 MG    [provider]  valsartan -hydrochlorothiazide  (DIOVAN -HCT) 80-12.5 MG tablet TAKE 1 TABLET BY MOUTH DAILY 06/15/23   Camnitz, Babetta Lesch, MD      Allergies    Beta adrenergic blockers, Topamax  [topiramate ], and Keflex [cephalexin]    Review of Systems   Review of Systems  Physical Exam Updated Vital Signs BP (!) 149/65   Pulse (!) 51   Temp 98.2 F (36.8 C) (Oral)   Resp (!) 23   Ht 5\' 7"  (1.702 m)   Wt 80.3 kg   SpO2 99%   BMI 27.72 kg/m  Physical Exam Constitutional:      Appearance: She is well-developed.  HENT:     Head: Normocephalic.  Cardiovascular:     Rate and Rhythm: Normal rate and regular rhythm.  Heart sounds: No murmur heard. Pulmonary:     Effort: Pulmonary effort is normal.     Breath sounds: Normal breath sounds. No wheezing, rhonchi or rales.  Abdominal:     General: Bowel sounds are normal.     Palpations: Abdomen is soft.     Tenderness: There is no abdominal tenderness. There is no guarding or rebound.  Musculoskeletal:        General: Normal range of motion.     Cervical back: Normal range of motion and neck supple.  Skin:    General: Skin is warm and dry.  Neurological:     General: No focal deficit present.     Mental Status: She is alert and oriented to person, place, and time.      GCS: GCS eye subscore is 4. GCS verbal subscore is 5. GCS motor subscore is 6.     Cranial Nerves: Cranial nerves 2-12 are intact. No dysarthria or facial asymmetry.     Sensory: Sensation is intact.     Motor: No weakness or pronator drift.     Coordination: Heel to Shin Test normal.     ED Results / Procedures / Treatments   Labs (all labs ordered are listed, but only abnormal results are displayed) Labs Reviewed  COMPREHENSIVE METABOLIC PANEL WITH GFR - Abnormal; Notable for the following components:      Result Value   Potassium 3.2 (*)    Glucose, Bld 103 (*)    Creatinine, Ser 1.01 (*)    All other components within normal limits  URINALYSIS, ROUTINE W REFLEX MICROSCOPIC - Abnormal; Notable for the following components:   Color, Urine COLORLESS (*)    Hgb urine dipstick TRACE (*)    Ketones, ur TRACE (*)    All other components within normal limits  CBC WITH DIFFERENTIAL/PLATELET    EKG EKG Interpretation Date/Time:  Saturday Oct 30 2023 10:25:47 EDT Ventricular Rate:  48 PR Interval:  186 QRS Duration:  104 QT Interval:  524 QTC Calculation: 469 R Axis:   -16  Text Interpretation: Sinus bradycardia Inferior infarct, old Confirmed by Abner Hoffman [phone]) on 10/30/2023 10:43:01 AM  Radiology CT Head Wo Contrast Result Date: 10/30/2023 CLINICAL DATA:  68 year old female with headache, dizziness, vertigo, nausea. EXAM: CT HEAD WITHOUT CONTRAST TECHNIQUE: Contiguous axial images were obtained from the base of the skull through the vertex without intravenous contrast. RADIATION DOSE REDUCTION: This exam was performed according to the departmental dose-optimization program which includes automated exposure control, adjustment of the mA and/or kV according to patient size and/or use of iterative reconstruction technique. COMPARISON:  Brain MRI 12/29/2018. Temporal bone CT 10/29/2021. FINDINGS: Brain: Small area of cystic encephalomalacia in the white matter adjacent to the right  frontal horn is stable since 2020. Background brain volume remains normal. No midline shift, ventriculomegaly, mass effect, evidence of mass lesion, intracranial hemorrhage or evidence of cortically based acute infarction. Small inferior basal ganglia perivascular spaces. Normal gray-white differentiation otherwise. Vascular: No suspicious intracranial vascular hyperdensity. Mild Calcified atherosclerosis at the skull base. Skull: Intact.  No acute osseous abnormality identified. Sinuses/Orbits: Chronic paranasal sinus disease, mucoperiosteal thickening most pronounced in the left maxillary sinus and similar to the 2020 MRI. Bilateral tympanic cavities and mastoids appear clear. Other: Orbits appear stable from the 2023 CT. No acute scalp soft tissue finding. IMPRESSION: 1. No acute intracranial abnormality. 2. Mild for age chronic small vessel disease stable since 2020. 3. Chronic paranasal sinus disease. Electronically Signed   By:  Marlise Simpers M.D.   On: 10/30/2023 12:15    Procedures Procedures  {Document cardiac monitor, telemetry assessment procedure when appropriate:1}  Medications Ordered in ED Medications  sodium chloride 0.9 % bolus 1,000 mL (0 mLs Intravenous Stopped 10/30/23 1151)  ondansetron (ZOFRAN) injection 4 mg (4 mg Intravenous Given 10/30/23 1104)    ED Course/ Medical Decision Making/ A&P   {   Click here for ABCD2, HEART and other calculatorsREFRESH Note before signing :1}                              Medical Decision Making This patient presents to the ED for concern of dizziness, headache, this involves an extensive number of treatment options, and is a complaint that carries with it a high risk of complications and morbidity.  The differential diagnosis includes vertigo, CVA, mass, SDH/SAH   Co morbidities that complicate the patient evaluation  H/o meniere's dz (recurrent vertigo), HTN, retinitis pigmentosa causing central vision loss, HLD   Additional history  obtained:  Additional history and/or information obtained from chart review, notable for  MRI (ordered by ENT) 2020: IMPRESSION: 1. Negative internal auditory imaging. 2. Evidence of chronic small vessel ischemia in the right anterior corona radiata, but otherwise normal for age MRI appearance of the brain. 3. Mild to moderate maxillary sinus disease.      Lab Tests:  I Ordered, and personally interpreted labs.  The pertinent results include:  ***    Imaging Studies ordered:  I ordered imaging studies including Head CT Per radiologist interpretation:  IMPRESSION: 1. No acute intracranial abnormality. 2. Mild for age chronic small vessel disease stable since 2020. 3. Chronic paranasal sinus disease.    Cardiac Monitoring:  The patient was maintained on a cardiac monitor.  I personally viewed and interpreted the cardiac monitored which showed an underlying rhythm of:  EKG Interpretation Date/Time:  Saturday Oct 30 2023 10:25:47 EDT Ventricular Rate:  48 PR Interval:  186 QRS Duration:  104 QT Interval:  524 QTC Calculation: 469 R Axis:   -16  Text Interpretation: Sinus bradycardia Inferior infarct, old Confirmed by Abner Hoffman [phone]) on 10/30/2023 10:43:01 AM   Medicines ordered and prescription drug management:  I ordered medication including Zofran for nausea Reevaluation of the patient after these medicines showed that the patient improved I have reviewed the patients home medicines and have made adjustments as needed   Test Considered:  N/a   Critical Interventions:  N/a   Consultations Obtained:  I requested consultation with the n/a,  and discussed lab and imaging findings as well as pertinent plan - they recommend: n/a   Problem List / ED Course:  Here with recurrent bouts of vertigo, c/w with previous history. Meclizine used yesterday with symptom resolution, returned this morning and unable to take Meclizine without vomiting. In the ED,  symptoms improved even without this medication.  No fever. States headache slightly different than her usual in that she feels discomfort on the left forehead and it is usually right sided.  Well appearing, nonfocal neuro exam, negative head CT. Symptoms improved/resolved. Do not feel symptoms represent stroke, therefore, MRI not felt indicated. This was discussed with Dr. Charlee Conine, ED attending. Discussed MRI imaging with patient and daughter who are in agreement that this is not indicated today.  Patient and daughter have no unaddressed concerns.   Reevaluation:  After the interventions noted above, I reevaluated the patient and found that  they have :improved   Social Determinants of Health:  Never a smoker   Disposition:  After consideration of the diagnostic results and the patients response to treatment, I feel that the patient would benefit from discharge home.   Amount and/or Complexity of Data Reviewed Labs: ordered. Radiology: ordered.  Risk Prescription drug management.     {Document critical care time when appropriate:1} {Document review of labs and clinical decision tools ie heart score, Chads2Vasc2 etc:1}  {Document your independent review of radiology images, and any outside records:1} {Document your discussion with family members, caretakers, and with consultants:1} {Document social determinants of health affecting pt's care:1} {Document your decision making why or why not admission, treatments were needed:1} Final Clinical Impression(s) / ED Diagnoses Final diagnoses:  Vertigo    Rx / DC Orders ED Discharge Orders          Ordered    ondansetron (ZOFRAN-ODT) 4 MG disintegrating tablet  Every 8 hours PRN        10/30/23 1317

## 2023-10-30 NOTE — ED Notes (Signed)
D&C IV 

## 2023-10-30 NOTE — ED Notes (Signed)
 Pt aware of the need for a urine... Unable to currently provide a sample.Marland KitchenMarland Kitchen

## 2023-10-30 NOTE — ED Triage Notes (Signed)
 Dizziness,vertigo,nausea happened in march, resolved after decongestants    This started about 2 weeks ago, getting worse, has hx migraines

## 2023-10-30 NOTE — Discharge Instructions (Addendum)
 Follow up with your primary care provider or with ENT - whoever can see you the quickest for recheck of today's symptoms felt to be recurrent vertigo, aka Meniere's.   Return to the ED with any new or concerning symptoms at any time.

## 2023-10-30 NOTE — ED Notes (Signed)
 Discharge paperwork given and verbally understood.

## 2023-11-01 DIAGNOSIS — I10 Essential (primary) hypertension: Secondary | ICD-10-CM | POA: Diagnosis not present

## 2023-11-01 DIAGNOSIS — H8103 Meniere's disease, bilateral: Secondary | ICD-10-CM | POA: Diagnosis not present

## 2023-11-01 DIAGNOSIS — R112 Nausea with vomiting, unspecified: Secondary | ICD-10-CM | POA: Diagnosis not present

## 2023-11-03 DIAGNOSIS — M7061 Trochanteric bursitis, right hip: Secondary | ICD-10-CM | POA: Diagnosis not present

## 2023-11-08 ENCOUNTER — Encounter: Payer: Self-pay | Admitting: Cardiovascular Disease

## 2023-11-10 ENCOUNTER — Telehealth (INDEPENDENT_AMBULATORY_CARE_PROVIDER_SITE_OTHER): Payer: Self-pay | Admitting: Otolaryngology

## 2023-11-10 NOTE — Telephone Encounter (Signed)
 Patient called in for an appointment - experiencing more dizzy spells.  Made an appointment for 07/08.  Currently taking Zofran  and Dramamine - states they seem to be helping, but would like to know what else can be done until she is seen.  Please advise.

## 2023-11-11 NOTE — Progress Notes (Signed)
 Cardiology Office Note:  .   Date:  11/12/2023  ID:  Fredie Jarvis, DOB 03-02-1956, MRN [id number] PCP: Ayesha Lente, Lowell Rude, FNP  [facility] Providers Cardiologist:  Antionette Kirks, MD Electrophysiologist:  Will Cortland Ding, MD {  History of Present Illness: .   Sheri Lopez is a 68 y.o. female with history of PAD, hypertension, PVCs, SVT/atrial tachycardia, legally blind, breast cancer, hypothyroidism     PAD Moderate nonobstructive bilateral external iliac artery disease with bruits on lower extremity arteriogram 12/2019.  Normal ABIs 11/2022 Medical management recommended Has had some right leg symptoms but felt related to arthritis in neuropathic pain.  Atrial tachycardia Managed by Dr. Lawana Pray, chronically on flecainide . Heart monitor 01/2022 1% SVT/ventricular ectopy. Did not tolerate diltiazem  due to fatigue.  Not on AV nodal blocking agents due to baseline bradycardia.    Patient is followed by cardiology for history of moderate nonobstructive bilateral external iliac artery disease that is being medically managed.  Also follows with Dr. Lawana Pray for atrial tachycardia that is well-controlled on flecainide .  Patient recently seen in the ER for vertigo and diagnosed with Mnire's disease.  Today patient is here for follow-up and wanted to discuss her blood pressure more.  She was concerned that during her vertigo episodes she has mildly elevated blood pressures in the 140-150 but otherwise has a blood pressure log that shows overall well-controlled blood pressures in the 120s.  Today she is 127s systolic in office.  She is following ENT as well as neurology for the above.  They have stopped her HCTZ.  Also had prescribed her Mucinex as they thought this was exacerbating symptoms.  She reports very mild swelling in her ankles.  No shortness of breath.  No orthopnea.  She did report a couple episodes of fatigue but says she will take a nap and the symptoms resolve  quickly.  She reports walking with a cane and denies any claudication symptoms.  Reports some arthritic pains.  Otherwise patient with no other significant complaints today.     ROS: Denies: Chest pain, shortness of breath, orthopnea, peripheral edema, palpitations, decreased exercise intolerance  Studies Reviewed: .         Risk Assessment/Calculations:             Physical Exam:   VS:  BP 126/74   Pulse 74   Ht 5' 7.5 (1.715 m)   Wt 181 lb 3.2 oz (82.2 kg)   SpO2 97%   BMI 27.96 kg/m    Wt Readings from Last 3 Encounters:  11/12/23 181 lb 3.2 oz (82.2 kg)  10/30/23 177 lb (80.3 kg)  09/06/23 186 lb (84.4 kg)    GEN: Well nourished, well developed in no acute distress NECK: No JVD; No carotid bruits CARDIAC: RRR, no murmurs, rubs, gallops RESPIRATORY:  Clear to auscultation without rales, wheezing or rhonchi  ABDOMEN: Soft, non-tender, non-distended EXTREMITIES:  No edema; No deformity   ASSESSMENT AND PLAN: .     PAD Moderate nonobstructive bilateral external iliac artery disease with bruits on lower extremity arteriogram 12/2019. Normal ABIs 11/2022.  Denies claudication, stable disease. Medical management recommended With aspirin  and rosuvastatin  10 mg  Atrial tachycardia Followed by EP.  Very low burden based off of heart monitor 01/2022.  Occasionally has some palpitations with her episodes of vertigo but these are brief and overall stable. Doing well on flecainide  50 mg twice a day.  Continue.  Follow-up  Hypertension Excellent control.  Today in office  126/74. Her Diovan  was replaced with just valsartan .  Continue valsartan  80 mg.  If she has more concerns of swelling and negative as needed Lasix but she does not think this is needed.   Hyperlipidemia LDL was 58 12/2022. Continue rosuvastatin  10 mg daily  Vertigo Followed by ENT and neurology.  Reportedly diagnosed with Mnire's disease but I cannot see these notes.  Low suspicion for this being cardiac  related.  Defer to them for further management.     Dispo: 1 year with Dr. Alvenia Aus  Signed, Burnetta Cart, PA-C

## 2023-11-12 ENCOUNTER — Encounter: Payer: Self-pay | Admitting: Physician Assistant

## 2023-11-12 ENCOUNTER — Ambulatory Visit: Attending: Physician Assistant | Admitting: Cardiology

## 2023-11-12 VITALS — BP 126/74 | HR 74 | Ht 67.5 in | Wt 181.2 lb

## 2023-11-12 DIAGNOSIS — E785 Hyperlipidemia, unspecified: Secondary | ICD-10-CM | POA: Diagnosis not present

## 2023-11-12 DIAGNOSIS — I739 Peripheral vascular disease, unspecified: Secondary | ICD-10-CM | POA: Diagnosis not present

## 2023-11-12 DIAGNOSIS — I4719 Other supraventricular tachycardia: Secondary | ICD-10-CM

## 2023-11-12 DIAGNOSIS — I10 Essential (primary) hypertension: Secondary | ICD-10-CM | POA: Diagnosis not present

## 2023-11-12 NOTE — Patient Instructions (Signed)
 Medication Instructions:  NO CHANGES *If you need a refill on your cardiac medications before your next appointment, please call your pharmacy*  Lab Work: NO LABS If you have labs (blood work) drawn today and your tests are completely normal, you will receive your results only by: MyChart Message (if you have MyChart) OR A paper copy in the mail If you have any lab test that is abnormal or we need to change your treatment, we will call you to review the results.  Testing/Procedures: NO TESTING  Follow-Up: At [facility], you and your health needs are our priority.  As part of our continuing mission to provide you with exceptional heart care, our providers are all part of one team.  This team includes your primary Cardiologist (physician) and Advanced Practice Providers or APPs (Physician Assistants and Nurse Practitioners) who all work together to provide you with the care you need, when you need it.  Your next appointment:   1 year(s)  Provider:   Antionette Kirks, MD

## 2023-11-16 ENCOUNTER — Encounter (INDEPENDENT_AMBULATORY_CARE_PROVIDER_SITE_OTHER): Payer: Self-pay | Admitting: Otolaryngology

## 2023-11-16 ENCOUNTER — Ambulatory Visit (INDEPENDENT_AMBULATORY_CARE_PROVIDER_SITE_OTHER): Admitting: Otolaryngology

## 2023-11-16 VITALS — BP 133/75 | HR 63

## 2023-11-16 DIAGNOSIS — H9201 Otalgia, right ear: Secondary | ICD-10-CM | POA: Diagnosis not present

## 2023-11-16 DIAGNOSIS — H8101 Meniere's disease, right ear: Secondary | ICD-10-CM

## 2023-11-16 DIAGNOSIS — H9041 Sensorineural hearing loss, unilateral, right ear, with unrestricted hearing on the contralateral side: Secondary | ICD-10-CM

## 2023-11-16 DIAGNOSIS — H9311 Tinnitus, right ear: Secondary | ICD-10-CM | POA: Diagnosis not present

## 2023-11-16 DIAGNOSIS — R42 Dizziness and giddiness: Secondary | ICD-10-CM

## 2023-11-16 MED ORDER — DIAZEPAM 2 MG PO TABS: 2.0000 mg | ORAL_TABLET | Freq: Three times a day (TID) | ORAL | 0 refills | Status: AC | PRN

## 2023-11-17 DIAGNOSIS — H9201 Otalgia, right ear: Secondary | ICD-10-CM | POA: Insufficient documentation

## 2023-11-17 DIAGNOSIS — H9311 Tinnitus, right ear: Secondary | ICD-10-CM | POA: Insufficient documentation

## 2023-11-17 NOTE — Progress Notes (Signed)
 Patient ID: Sheri Lopez, female   DOB: [birth year], 68 y.o.   MRN: [id number]  CC: Increasing dizziness, right ear pressure  HPI: The patient is a 68 year old female who presents today complaining of increasing dizziness over the past 3 months.  The patient has a history of right ear Mnire's disease, asymmetric right ear hearing loss, and right ear tinnitus.  Her previous MRI scan was negative for retrocochlear lesion.  She was treated with dexamethasone infusion, diuretic, and low-salt diet.  At her last visit in February 2025, she was noted to have progressive worsening of her asymmetric right ear sensorineural hearing loss.  The patient returns today complaining of increasing frequency and severity of her dizziness.  She describes her dizziness as a spinning vertigo the last for several hours.  She has also noted frequent right ear pressure.  She has self treated with Dramamine and Zofran .  The patient was recently admitted to the hospital for dehydration.  Her primary care physician has taken her off her hydrochlorothiazide  diuretic.  Exam: General: Communicates without difficulty, well nourished, no acute distress. Head: Normocephalic, no evidence injury, no tenderness, facial buttresses intact without stepoff. Eyes: PERRL, EOMI. No scleral icterus, conjunctivae clear. Neuro: CN II exam reveals vision grossly decreased. No nystagmus at any point of gaze. Ears: Auricles well formed without lesions.  Both ear canals and tympanic membranes are normal.  Nose: External evaluation reveals normal support and skin without lesions. Dorsum is intact. Anterior rhinoscopy reveals congested mucosa over anterior aspect of inferior turbinates and intact septum. No purulence noted. Oral:  Oral cavity and oropharynx are intact, symmetric, without erythema or edema. Mucosa is moist without lesions. Neck: Full range of motion without pain. There is no significant lymphadenopathy. No masses palpable. Thyroid  bed  within normal limits to palpation. Parotid glands and submandibular glands equal bilaterally without mass. Trachea is midline. Neuro:  CN 2-12 grossly intact. Gait wide-based. Vestibular: No nystagmus at any point of gaze. The cerebellar examination is unremarkable.   Assessment: 1.  Recurrent dizziness, likely secondary to acute exacerbations of her right ear Mnire's disease. 2.  Her ear canals, tympanic membranes, and middle ear spaces are normal. 3.  Her asymmetric right ear sensorineural hearing loss, right ear tinnitus, and right aural pressure are likely parts of her Mnire's disease.  Plan: 1.  The physical exam findings are reviewed with the patient. 2.  Continue with her 1500 mg low-salt diet. 3.  The pathophysiology and typical clinical course of Mnire's disease are extensively discussed with the patient. 4.  Diazepam as needed to treat her acute dizziness. 5.  If she continues to be symptomatic, she may benefit from surgical intervention with right endolymphatic sac decompression surgery. 6.  Temporal bone CT scan for preop planning. 7.  The patient will return for reevaluation in 1 month.

## 2023-12-06 ENCOUNTER — Other Ambulatory Visit: Payer: Self-pay | Admitting: Cardiovascular Disease

## 2023-12-07 ENCOUNTER — Ambulatory Visit (INDEPENDENT_AMBULATORY_CARE_PROVIDER_SITE_OTHER): Admitting: Otolaryngology

## 2023-12-15 ENCOUNTER — Other Ambulatory Visit (HOSPITAL_BASED_OUTPATIENT_CLINIC_OR_DEPARTMENT_OTHER)

## 2023-12-15 ENCOUNTER — Ambulatory Visit (HOSPITAL_BASED_OUTPATIENT_CLINIC_OR_DEPARTMENT_OTHER)
Admission: RE | Admit: 2023-12-15 | Discharge: 2023-12-15 | Disposition: A | Discharge status: Home / Self Care | Source: Ambulatory Visit | Attending: Otolaryngology | Admitting: Otolaryngology

## 2023-12-15 DIAGNOSIS — H8101 Meniere's disease, right ear: Secondary | ICD-10-CM | POA: Diagnosis not present

## 2024-01-03 ENCOUNTER — Other Ambulatory Visit: Payer: Self-pay | Admitting: Family Medicine

## 2024-01-03 DIAGNOSIS — Z1231 Encounter for screening mammogram for malignant neoplasm of breast: Secondary | ICD-10-CM

## 2024-01-10 ENCOUNTER — Encounter (INDEPENDENT_AMBULATORY_CARE_PROVIDER_SITE_OTHER): Payer: Self-pay | Admitting: Otolaryngology

## 2024-01-10 ENCOUNTER — Ambulatory Visit (INDEPENDENT_AMBULATORY_CARE_PROVIDER_SITE_OTHER): Payer: Medicare Other | Admitting: Audiology

## 2024-01-10 ENCOUNTER — Ambulatory Visit (INDEPENDENT_AMBULATORY_CARE_PROVIDER_SITE_OTHER): Payer: Medicare Other | Admitting: Otolaryngology

## 2024-01-10 VITALS — BP 101/64 | HR 69 | Ht 67.5 in | Wt 160.0 lb

## 2024-01-10 DIAGNOSIS — H9041 Sensorineural hearing loss, unilateral, right ear, with unrestricted hearing on the contralateral side: Secondary | ICD-10-CM | POA: Diagnosis not present

## 2024-01-10 DIAGNOSIS — R42 Dizziness and giddiness: Secondary | ICD-10-CM | POA: Diagnosis not present

## 2024-01-10 DIAGNOSIS — H9311 Tinnitus, right ear: Secondary | ICD-10-CM

## 2024-01-10 DIAGNOSIS — H9201 Otalgia, right ear: Secondary | ICD-10-CM

## 2024-01-10 DIAGNOSIS — H903 Sensorineural hearing loss, bilateral: Secondary | ICD-10-CM | POA: Diagnosis not present

## 2024-01-10 MED ORDER — PREDNISONE 10 MG (21) PO TBPK: ORAL_TABLET | ORAL | 0 refills | Status: AC

## 2024-01-10 NOTE — Progress Notes (Signed)
  [address], [address] [place], [place] [address] [phone]  Audiological Evaluation    Name: Sheri Lopez     DOB:   [birth year]      MRN:   [id number]                                                                                     Service Date: 01/10/2024     Accompanied by: unaccompanied to the booth   Patient comes today after Dr. Karis, ENT sent a referral for a hearing evaluation due to concerns with dizziness.   Symptoms Yes Details  Hearing loss  [x]  No perceived changes by patient  Tinnitus  []    Ear pain/ infections/pressure  []    Balance problems  [x]  Reports spinning was bad in May and June 2025  Noise exposure history  []    Previous ear surgeries  []    Family history of hearing loss  []    Amplification  [x]  Right Resound hearing aid  Other  [x]  Used to have headaches. Reports that some loud noises seem like they vibrate     Otoscopy: Right ear: Clear external ear canal and notable landmarks visualized on the tympanic membrane. Left ear:  Clear external ear canal and notable landmarks visualized on the tympanic membrane.  Tympanometry: Right ear: Type A- Normal external ear canal volume with normal middle ear pressure and tympanic membrane compliance. Left ear: Type A- Normal external ear canal volume with normal middle ear pressure and tympanic membrane compliance.    Pure tone Audiometry: Right ear- Moderate to severe sensorineural hearing loss from 125 Hz - 8000 Hz. Left ear-  Normal hearing from [phone] Hz, then mild  sensorineural hearing loss from 4000 Hz - 80000 Hz.  Speech Audiometry: Right ear- Speech Reception Threshold (SRT) was obtained at 70 dBHL, with contralateral masking. Left ear-Speech Reception Threshold (SRT) was obtained at 20 dBHL.  Right hearing asymmetry continues to be observed.   Word Recognition Score Tested using NU-6 (recorded) Right ear: 30% was obtained at a presentation level of 90 dBHL with contralateral  masking which is deemed as  poor. Left ear: 100% was obtained at a presentation level of 60 dBHL with contralateral masking which is deemed as  excellent.   The hearing test results were completed under headphones and results are deemed to be of good reliability. Test technique:  conventional    Impression: Right sided word discrimination score dropped when compared to her hearing test from 07-09-2023.  Recommendations: Follow up with ENT as scheduled for today. Return for a hearing evaluation if concerns with hearing changes arise or per MD recommendation. Recommend hearing aid use, run real ear measurements, and consider a left hearing aid.  Of note, I added the wax filter to the speaker that she had lost. She will call me to see if I can see her if that happens again.  Deyona Soza MARIE LEROUX-MARTINEZ, AUD

## 2024-01-12 NOTE — Progress Notes (Signed)
 Patient ID: Sheri Lopez, female   DOB: Jan 20, 1956, 68 y.o.   MRN: [id number]  Follow-up: Increasing dizziness, right ear hearing loss/tinnitus, right ear pressure  HPI: The patient is a 68 year old female who returns today for her follow-up evaluation.  The patient has a history of recurrent dizziness, asymmetric right ear hearing loss, right ear tinnitus, and right ear pressure.  She was diagnosed with right ear Mnire's disease.  Her MRI scan was negative. She was treated with dexamethasone  infusion, diuretic, and low-salt diet.  At her last visit in June 2025, the patient was complaining of more recurrent dizziness.  She was placed on diazepam  as needed to treat the dizziness.  The option of endolymphatic sac decompression surgery was also discussed.  She subsequently underwent a temporal bone CT scan for preop planning.  The patient returns today reporting slight improvement in her dizziness.  She is still having right ear hearing difficulty.  She wears a right ear hearing aid.  Currently she denies any otalgia, otorrhea, or vertigo.  Exam: General: Communicates without difficulty, well nourished, no acute distress. Head: Normocephalic, no evidence injury, no tenderness, facial buttresses intact without stepoff. Eyes: PERRL, EOMI. No scleral icterus, conjunctivae clear. Neuro: CN II exam reveals vision grossly decreased. No nystagmus at any point of gaze. Ears: Auricles well formed without lesions.  Both ear canals and tympanic membranes are normal.  Nose: External evaluation reveals normal support and skin without lesions. Dorsum is intact. Anterior rhinoscopy reveals congested mucosa over anterior aspect of inferior turbinates and intact septum. No purulence noted. Oral:  Oral cavity and oropharynx are intact, symmetric, without erythema or edema. Mucosa is moist without lesions. Neck: Full range of motion without pain. There is no significant lymphadenopathy. No masses palpable. Thyroid  bed  within normal limits to palpation. Parotid glands and submandibular glands equal bilaterally without mass. Trachea is midline. Neuro:  CN 2-12 grossly intact. Gait wide-based. Vestibular: No nystagmus at any point of gaze. The cerebellar examination is unremarkable.    Her hearing test shows progressive worsening of her right ear sensorineural hearing loss.  Assessment: 1.  Recurrent dizziness, secondary to acute exacerbations of her right ear Mnire's disease. 2.  Progressive asymmetric right ear sensorineural hearing loss, right ear tinnitus, and right aural pressure. 3.  Her ear canals, tympanic membranes, and middle ear spaces are normal.  Plan: 1.  The physical exam findings and the hearing test results are reviewed with the patient. 2.  Continue with her 1500 mg low-salt diet. 3.  The pathophysiology and clinical course of Mnire's disease are again discussed with the patient. 4.  Continue the use of her right ear hearing aid. 5.  Prednisone  Dosepak for 6 days.   6.  The option of surgical intervention with right endolymphatic sac decompression surgery is discussed.  The risk, benefits, and details of the procedure are reviewed. 7.  The patient would like to consider her options.  She will return for reevaluation in 3 months, sooner if needed.

## 2024-01-17 ENCOUNTER — Other Ambulatory Visit (INDEPENDENT_AMBULATORY_CARE_PROVIDER_SITE_OTHER): Payer: Self-pay | Admitting: Otolaryngology

## 2024-01-17 DIAGNOSIS — E039 Hypothyroidism, unspecified: Secondary | ICD-10-CM | POA: Diagnosis not present

## 2024-01-17 DIAGNOSIS — I10 Essential (primary) hypertension: Secondary | ICD-10-CM | POA: Diagnosis not present

## 2024-01-17 DIAGNOSIS — H8101 Meniere's disease, right ear: Secondary | ICD-10-CM

## 2024-01-17 DIAGNOSIS — E785 Hyperlipidemia, unspecified: Secondary | ICD-10-CM | POA: Diagnosis not present

## 2024-01-17 DIAGNOSIS — J4520 Mild intermittent asthma, uncomplicated: Secondary | ICD-10-CM | POA: Diagnosis not present

## 2024-01-17 DIAGNOSIS — Z0000 Encounter for general adult medical examination without abnormal findings: Secondary | ICD-10-CM | POA: Diagnosis not present

## 2024-01-17 DIAGNOSIS — F411 Generalized anxiety disorder: Secondary | ICD-10-CM | POA: Diagnosis not present

## 2024-01-17 DIAGNOSIS — E559 Vitamin D deficiency, unspecified: Secondary | ICD-10-CM | POA: Diagnosis not present

## 2024-01-18 ENCOUNTER — Encounter: Payer: Self-pay | Admitting: Audiology

## 2024-02-15 DIAGNOSIS — H8101 Meniere's disease, right ear: Secondary | ICD-10-CM | POA: Diagnosis not present

## 2024-02-15 HISTORY — PX: ENDOLYMPHATIC SAC DECOMPRESSION: SHX6529

## 2024-02-18 ENCOUNTER — Ambulatory Visit
Admission: RE | Admit: 2024-02-18 | Discharge: 2024-02-18 | Disposition: A | Discharge status: Home / Self Care | Source: Ambulatory Visit | Attending: Family Medicine | Admitting: Family Medicine

## 2024-02-18 DIAGNOSIS — Z1231 Encounter for screening mammogram for malignant neoplasm of breast: Secondary | ICD-10-CM

## 2024-02-21 ENCOUNTER — Encounter (INDEPENDENT_AMBULATORY_CARE_PROVIDER_SITE_OTHER): Payer: Self-pay | Admitting: Otolaryngology

## 2024-02-22 ENCOUNTER — Encounter (INDEPENDENT_AMBULATORY_CARE_PROVIDER_SITE_OTHER): Payer: Self-pay | Admitting: Otolaryngology

## 2024-02-22 ENCOUNTER — Ambulatory Visit (INDEPENDENT_AMBULATORY_CARE_PROVIDER_SITE_OTHER): Admitting: Otolaryngology

## 2024-02-22 VITALS — BP 136/74 | HR 56 | Temp 97.7°F

## 2024-02-22 DIAGNOSIS — Z9889 Other specified postprocedural states: Secondary | ICD-10-CM

## 2024-02-22 DIAGNOSIS — R42 Dizziness and giddiness: Secondary | ICD-10-CM

## 2024-02-22 NOTE — Progress Notes (Signed)
 Patient ID: Sheri Lopez, female   DOB: 05-26-1956, 68 y.o.   MRN: [id number]  No postop difficulty.  She had 1 episode of dizziness since the surgery.  Her postauricular incision is healing well.  Right middle ear fluid is noted.  Continue with her low-salt diet.  Recheck in 4 to 5 months.

## 2024-02-24 ENCOUNTER — Telehealth: Payer: Self-pay | Admitting: Family Medicine

## 2024-02-24 NOTE — Telephone Encounter (Signed)
 Pt called to cancel apt , PT states she is not taking medication ,  appointment is not needed  at this time  Appt Canceled

## 2024-03-15 ENCOUNTER — Other Ambulatory Visit: Payer: Medicare Other

## 2024-03-24 ENCOUNTER — Other Ambulatory Visit: Payer: Self-pay | Admitting: Cardiology

## 2024-03-27 ENCOUNTER — Ambulatory Visit: Admitting: Family Medicine

## 2024-03-28 MED ORDER — VALSARTAN-HYDROCHLOROTHIAZIDE 80-12.5 MG PO TABS: 1.0000 | ORAL_TABLET | Freq: Every day | ORAL | 0 refills | Status: DC

## 2024-04-11 ENCOUNTER — Ambulatory Visit (INDEPENDENT_AMBULATORY_CARE_PROVIDER_SITE_OTHER): Admitting: Audiology

## 2024-04-11 ENCOUNTER — Ambulatory Visit (INDEPENDENT_AMBULATORY_CARE_PROVIDER_SITE_OTHER): Admitting: Otolaryngology

## 2024-04-12 ENCOUNTER — Ambulatory Visit (HOSPITAL_BASED_OUTPATIENT_CLINIC_OR_DEPARTMENT_OTHER)
Admission: RE | Admit: 2024-04-12 | Discharge: 2024-04-12 | Disposition: A | Discharge status: Home / Self Care | Source: Ambulatory Visit | Attending: Family Medicine | Admitting: Family Medicine

## 2024-04-12 DIAGNOSIS — Z780 Asymptomatic menopausal state: Secondary | ICD-10-CM | POA: Diagnosis not present

## 2024-04-12 DIAGNOSIS — M85851 Other specified disorders of bone density and structure, right thigh: Secondary | ICD-10-CM | POA: Diagnosis not present

## 2024-04-12 DIAGNOSIS — E2839 Other primary ovarian failure: Secondary | ICD-10-CM | POA: Diagnosis not present

## 2024-04-12 DIAGNOSIS — M85852 Other specified disorders of bone density and structure, left thigh: Secondary | ICD-10-CM | POA: Diagnosis not present

## 2024-05-19 ENCOUNTER — Encounter: Payer: Self-pay | Admitting: Cardiology

## 2024-05-19 DIAGNOSIS — R110 Nausea: Secondary | ICD-10-CM | POA: Diagnosis not present

## 2024-05-19 DIAGNOSIS — E860 Dehydration: Secondary | ICD-10-CM | POA: Diagnosis not present

## 2024-05-19 DIAGNOSIS — K219 Gastro-esophageal reflux disease without esophagitis: Secondary | ICD-10-CM | POA: Diagnosis not present

## 2024-05-19 DIAGNOSIS — R42 Dizziness and giddiness: Secondary | ICD-10-CM | POA: Diagnosis not present

## 2024-05-26 ENCOUNTER — Telehealth (INDEPENDENT_AMBULATORY_CARE_PROVIDER_SITE_OTHER): Payer: Self-pay

## 2024-05-26 NOTE — Telephone Encounter (Signed)
 Patient called and I answered. Patient stated that Dr. Karis did a surgery on her ears in September for her Dizzy spells. Patient stated that the surgery worked for a while and everything was going good till a couple weeks ago the dizzy spells have came back. Patient stated she would like to be seen with Dr. Karis so she can see if the surgery still went well or if there is a new thing causing this problem. Patient requested a call back.

## 2024-06-05 ENCOUNTER — Ambulatory Visit (INDEPENDENT_AMBULATORY_CARE_PROVIDER_SITE_OTHER): Payer: Self-pay

## 2024-06-05 DIAGNOSIS — H903 Sensorineural hearing loss, bilateral: Secondary | ICD-10-CM

## 2024-06-05 NOTE — Progress Notes (Signed)
" °  [address] [place], [place] [address] [phone]  Hearing Aid Check     Sheri Lopez comes for a scheduled appointment for a hearing aid check.  Accompanied ab:dozq   Right   Hearing aid manufacturer Clancy Griffith 5 [id number]  Hearing aid style Receiver in the canal  Hearing aid battery rechargeable  Receiver  Dome/ custom earpiece Small or medium power dome  Retention wire    Warranty expiration date 01-26-2025  Loss and Damage 01-26-2025  Initial fitting date 01-02-2022  Device was fit at: [facility]     Chief complaint: Patient reports that the hearing aid loses charge and only last less than 15 hours whereas when she purchased the hearing aid, it lasted over a day and a half.   Also, she would like help with learning how to change the hearing aid's wax guard.     Actions taken: Inspection of the charger indicated that it was fully charged (solid green light).  Discussed with the patient that sometimes hearing aid lithium batteries can lose their charge and thus the battery would need to be replaced by the manufacturer.  Discussed with the patient that the average turn around repair time for repair is 2-3 weeks.  Sheri Lopez would like to wait to send the hearing aid in for repair in April when she has time to be without her hearing aid.  An appointment was made for mid-April.      Spent time helping her understand how to replace the wax guard.  After helping her, Sheri Lopez indicated that she is now comfortable with replacing the wax guard on her own.    Services fee: $0 was paid at checkout (hearing aid in warranty).  Recommend: Return for a hearing aid check in April or sooner if she is able to be without the hearing aid for 2-3 weeks.  Return for a hearing evaluation and to see an ENT, if concerns with hearing changes arise.  NOTE: AFTER CHECKING OUT, THE PATIENT RETURN AND ASKED THAT THE HEARING AID BE SENT IN FOR REPAIR.  HEARING AID  SENT FOR REPAIR.   Sheri Lopez, AUD "

## 2024-06-07 ENCOUNTER — Telehealth (INDEPENDENT_AMBULATORY_CARE_PROVIDER_SITE_OTHER): Payer: Self-pay

## 2024-06-07 NOTE — Telephone Encounter (Signed)
 Patient called stating that she was here Monday and wanted to bring her hearing aids back in to be sent off for repair. I let her know that she could just drop them off and that I would let the audiologist know. Patient understood.

## 2024-06-14 ENCOUNTER — Telehealth (INDEPENDENT_AMBULATORY_CARE_PROVIDER_SITE_OTHER): Payer: Self-pay

## 2024-06-14 ENCOUNTER — Ambulatory Visit (INDEPENDENT_AMBULATORY_CARE_PROVIDER_SITE_OTHER): Payer: Self-pay

## 2024-06-14 DIAGNOSIS — H903 Sensorineural hearing loss, bilateral: Secondary | ICD-10-CM

## 2024-06-14 NOTE — Progress Notes (Signed)
" °  [address] [place], [place] [address] [phone]  Hearing Aid Check     Sheri Lopez comes for a scheduled appointment for a hearing aid check.  Time in:315 Time out: 400 Accompanied ab:dzoq   Right  Right  Hearing aid manufacturer Clancy Griffith 5 [id number]  Hearing aid style Receiver in the canal  Hearing aid battery rechargeable  Receiver  Dome/ custom earpiece Small or medium power dome  Retention wire    Warranty expiration date 01-26-2025  Loss and Damage 01-26-2025  Initial fitting date 01-02-2022  Device was fit at: [facility]    Chief complaint: Hearing aid is back from repair (Shipment number [id number]).  Components and housing replaced.  Program setting restored to first fit prescription.     Actions taken: Connected hearing aid to software to program to last settings before repair and performed feedback calibration in software.   Verified MPO only through [employer].  Spent time helping patient understand Resound app for streaming using the sound enhancer.  Patient indicated satisfaction with sound quality, MPO and was able to manipulate Resound app with more confidence.    Services fee: $0 was paid at checkout.  Recommend: Return for a hearing aid check if questions or concerns arise. Return for a hearing evaluation and to see an ENT, if concerns with hearing changes arise.  Arlean Rake, AUD "

## 2024-06-14 NOTE — Telephone Encounter (Signed)
 Hi!    Resound Hearing Aid for this patient was received today after being sent out for repair.  Right Hearing Aid not holding a charge.  All components and housing replaced. Program setting restored.  Warranty expires 01/26/2025.  Hearing aid and packing slip information are in the patient's blue tray on your desk.  Thank you. Ermelinda

## 2024-06-20 ENCOUNTER — Encounter (INDEPENDENT_AMBULATORY_CARE_PROVIDER_SITE_OTHER): Payer: Self-pay | Admitting: Otolaryngology

## 2024-06-20 ENCOUNTER — Ambulatory Visit (INDEPENDENT_AMBULATORY_CARE_PROVIDER_SITE_OTHER): Admitting: Otolaryngology

## 2024-06-20 VITALS — BP 144/74 | HR 70 | Ht 67.5 in | Wt 185.0 lb

## 2024-06-20 DIAGNOSIS — H9041 Sensorineural hearing loss, unilateral, right ear, with unrestricted hearing on the contralateral side: Secondary | ICD-10-CM

## 2024-06-20 DIAGNOSIS — H9311 Tinnitus, right ear: Secondary | ICD-10-CM

## 2024-06-20 DIAGNOSIS — J310 Chronic rhinitis: Secondary | ICD-10-CM | POA: Insufficient documentation

## 2024-06-20 DIAGNOSIS — J309 Allergic rhinitis, unspecified: Secondary | ICD-10-CM

## 2024-06-20 DIAGNOSIS — H8101 Meniere's disease, right ear: Secondary | ICD-10-CM | POA: Diagnosis not present

## 2024-06-20 DIAGNOSIS — R42 Dizziness and giddiness: Secondary | ICD-10-CM

## 2024-06-20 NOTE — Progress Notes (Signed)
 Patient ID: Sheri Lopez, female   DOB: [birth year], 69 y.o.   MRN: [id number]  Follow up: Recurrent dizziness, right ear hearing loss and tinnitus, right ear Mnire's disease  History of Present Illness Sheri Lopez is a 69 year old female with Meniere's disease status post right endolymphatic sac decompression who presents for follow-up regarding dizziness, hearing loss, and sinus symptoms.  She underwent right endolymphatic sac decompression four months ago for Meniere's disease. For the first two months postoperatively, she experienced complete resolution of dizziness and vertigo.  Over the past two months, she has developed intermittent dizziness described as a sensation of being off balance, without spinning vertigo. The dizziness is less severe and less frequent than prior to surgery. She has not experienced episodes of spinning. During the Christmas holiday, she had two episodes of visual disturbance characterized by a sensation of the visual field dropping out and underwater-like distortion, with associated visual shaking. These episodes were less frequent and less severe than before surgery. No current spinning vertigo is present.  She experienced increased symptoms during the Thanksgiving and Christmas holidays, which she attributes to stress, dietary changes, and seasonal allergies. During this period, she had nausea and vomiting. Adherence to a low salt diet was challenging, and she has noted elevated blood pressure recently.  She reports improvement in her right ear hearing loss and tinnitus since the surgery.  She is currently wearing a right ear hearing aid.  She reports recurrent sinus congestion associated with weather changes and seasonal allergies. She has not been using Flonase recently and prefers Nasonex over Flonase. She does not use other steroid nasal sprays.  She describes muscle pain in the posterior neck region after sleeping on her stomach, resulting in  significant discomfort.   Exam: General: Communicates without difficulty, well nourished, no acute distress. Head: Normocephalic, no evidence injury, no tenderness, facial buttresses intact without stepoff. Face/sinus: No tenderness to palpation and percussion. Facial movement is normal and symmetric. Eyes: PERRL, EOMI. No scleral icterus, conjunctivae clear. Neuro: CN II exam reveals vision grossly intact.  No nystagmus at any point of gaze. Ears: Auricles well formed without lesions.  Ear canals are intact without mass or lesion.  No erythema or edema is appreciated.  The TMs are intact without fluid. Nose: External evaluation reveals normal support and skin without lesions.  Dorsum is intact.  Anterior rhinoscopy reveals congested mucosa over anterior aspect of inferior turbinates and intact septum.  No purulence noted. Oral:  Oral cavity and oropharynx are intact, symmetric, without erythema or edema.  Mucosa is moist without lesions. Neck: Full range of motion without pain.  There is no significant lymphadenopathy.  No masses palpable.  Thyroid  bed within normal limits to palpation.  Parotid glands and submandibular glands equal bilaterally without mass.  Trachea is midline. Neuro:  CN 2-12 grossly intact.    Assessment and Plan Assessment & Plan Meniere's disease status post endolymphatic sac decompression Four months post-endolymphatic sac decompression, she reports that the first four months after surgery were wonderful with no dizziness or spinning, but she has recently experienced a mild recurrence of intermittent disequilibrium and rare visual disturbances. No current episodes of rotational vertigo. Symptoms fluctuate with weather, stress, dietary indiscretion, and hypertension. The surgical site is well-healed.  - The pathophysiology of Mnire's disease and dizziness are extensively discussed. - Reinforced strict adherence to a low sodium diet. - Provided education regarding symptom  fluctuation with weather changes and stress. - Scheduled follow-up in six months. -  Her asymmetric right ear hearing loss and tinnitus have improved.  Allergic rhinitis Allergic rhinitis with current nasal congestion, likely exacerbated by seasonal and environmental factors. No evidence of acute infection on examination. She has not recently used intranasal corticosteroids. - Recommended resuming intranasal corticosteroid spray (Flonase, Nasonex, Nasacort, Rhinocort, or other over-the-counter options) per preference.

## 2024-06-27 ENCOUNTER — Ambulatory Visit (INDEPENDENT_AMBULATORY_CARE_PROVIDER_SITE_OTHER): Admitting: Otolaryngology

## 2024-06-28 ENCOUNTER — Other Ambulatory Visit: Payer: Self-pay | Admitting: Cardiology

## 2024-09-19 ENCOUNTER — Ambulatory Visit (INDEPENDENT_AMBULATORY_CARE_PROVIDER_SITE_OTHER): Admitting: Audiology

## 2024-12-26 ENCOUNTER — Ambulatory Visit (INDEPENDENT_AMBULATORY_CARE_PROVIDER_SITE_OTHER): Admitting: Otolaryngology
